# Patient Record
Sex: Male | Born: 1995 | Race: White | Hispanic: No | Marital: Single | Smoking: Never smoker
Health system: Southern US, Community
[De-identification: ages and names within clinical notes are randomized; demographics above are authoritative.]

---
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
## 2020-05-03 ENCOUNTER — Emergency Department (HOSPITAL_COMMUNITY)
Admission: EM | Admit: 2020-05-03 | Discharge: 2020-05-03 | Discharge status: Against Medical Advice | Payer: BLUE CROSS/BLUE SHIELD

## 2020-05-03 ENCOUNTER — Other Ambulatory Visit: Payer: Self-pay

## 2020-08-12 ENCOUNTER — Observation Stay: Payer: BLUE CROSS/BLUE SHIELD

## 2020-08-12 ENCOUNTER — Other Ambulatory Visit: Payer: Self-pay

## 2020-08-12 ENCOUNTER — Encounter: Payer: Self-pay | Admitting: Internal Medicine

## 2020-08-12 ENCOUNTER — Emergency Department: Payer: BLUE CROSS/BLUE SHIELD

## 2020-08-12 ENCOUNTER — Inpatient Hospital Stay
Admission: EM | Admit: 2020-08-12 | Discharge: 2020-08-15 | DRG: 418 | Disposition: A | Discharge status: Home / Self Care | Payer: BLUE CROSS/BLUE SHIELD | Attending: Internal Medicine | Admitting: Internal Medicine

## 2020-08-12 DIAGNOSIS — L03311 Cellulitis of abdominal wall: Secondary | ICD-10-CM | POA: Diagnosis not present

## 2020-08-12 DIAGNOSIS — Z6837 Body mass index (BMI) 37.0-37.9, adult: Secondary | ICD-10-CM

## 2020-08-12 DIAGNOSIS — T8149XA Infection following a procedure, other surgical site, initial encounter: Secondary | ICD-10-CM | POA: Diagnosis not present

## 2020-08-12 DIAGNOSIS — K8020 Calculus of gallbladder without cholecystitis without obstruction: Secondary | ICD-10-CM | POA: Diagnosis present

## 2020-08-12 DIAGNOSIS — K851 Biliary acute pancreatitis without necrosis or infection: Secondary | ICD-10-CM

## 2020-08-12 DIAGNOSIS — Z833 Family history of diabetes mellitus: Secondary | ICD-10-CM

## 2020-08-12 DIAGNOSIS — I10 Essential (primary) hypertension: Secondary | ICD-10-CM | POA: Diagnosis present

## 2020-08-12 DIAGNOSIS — K859 Acute pancreatitis without necrosis or infection, unspecified: Secondary | ICD-10-CM | POA: Diagnosis present

## 2020-08-12 DIAGNOSIS — K920 Hematemesis: Secondary | ICD-10-CM | POA: Diagnosis present

## 2020-08-12 DIAGNOSIS — E669 Obesity, unspecified: Secondary | ICD-10-CM | POA: Diagnosis present

## 2020-08-12 DIAGNOSIS — K760 Fatty (change of) liver, not elsewhere classified: Secondary | ICD-10-CM | POA: Diagnosis present

## 2020-08-12 DIAGNOSIS — Z8249 Family history of ischemic heart disease and other diseases of the circulatory system: Secondary | ICD-10-CM

## 2020-08-12 DIAGNOSIS — Z20822 Contact with and (suspected) exposure to covid-19: Secondary | ICD-10-CM | POA: Diagnosis present

## 2020-08-12 DIAGNOSIS — Y838 Other surgical procedures as the cause of abnormal reaction of the patient, or of later complication, without mention of misadventure at the time of the procedure: Secondary | ICD-10-CM | POA: Diagnosis not present

## 2020-08-12 LAB — URINALYSIS, COMPLETE (UACMP) WITH MICROSCOPIC
Bacteria, UA: NONE SEEN
Bilirubin Urine: NEGATIVE
Glucose, UA: NEGATIVE mg/dL
Hgb urine dipstick: NEGATIVE
Ketones, ur: 5 mg/dL — AB
Leukocytes,Ua: NEGATIVE
Nitrite: NEGATIVE
Protein, ur: 30 mg/dL — AB
Specific Gravity, Urine: 1.025 (ref 1.005–1.030)
Squamous Epithelial / LPF: NONE SEEN (ref 0–5)
pH: 7 (ref 5.0–8.0)

## 2020-08-12 LAB — COMPREHENSIVE METABOLIC PANEL
ALT: 166 U/L — ABNORMAL HIGH (ref 0–44)
AST: 63 U/L — ABNORMAL HIGH (ref 15–41)
Albumin: 4.2 g/dL (ref 3.5–5.0)
Alkaline Phosphatase: 103 U/L (ref 38–126)
Anion gap: 10 (ref 5–15)
BUN: 17 mg/dL (ref 6–20)
CO2: 23 mmol/L (ref 22–32)
Calcium: 8.5 mg/dL — ABNORMAL LOW (ref 8.9–10.3)
Chloride: 99 mmol/L (ref 98–111)
Creatinine, Ser: 0.67 mg/dL (ref 0.61–1.24)
GFR, Estimated: >60 mL/min (ref >60)
Glucose, Bld: 125 mg/dL — ABNORMAL HIGH (ref 70–99)
Potassium: 3.6 mmol/L (ref 3.5–5.1)
Sodium: 132 mmol/L — ABNORMAL LOW (ref 135–145)
Total Bilirubin: 1.5 mg/dL — ABNORMAL HIGH (ref 0.3–1.2)
Total Protein: 7.5 g/dL (ref 6.5–8.1)

## 2020-08-12 LAB — CBC
HCT: 46.8 % (ref 39.0–52.0)
Hemoglobin: 15.6 g/dL (ref 13.0–17.0)
MCH: 28.4 pg (ref 26.0–34.0)
MCHC: 33.3 g/dL (ref 30.0–36.0)
MCV: 85.1 fL (ref 80.0–100.0)
Platelets: 396 10*3/uL (ref 150–400)
RBC: 5.5 MIL/uL (ref 4.22–5.81)
RDW: 13.3 % (ref 11.5–15.5)
WBC: 14.9 10*3/uL — ABNORMAL HIGH (ref 4.0–10.5)
nRBC: 0 % (ref 0.0–0.2)

## 2020-08-12 LAB — LIPID PANEL
Cholesterol: 151 mg/dL (ref 0–200)
HDL: 37 mg/dL — ABNORMAL LOW
LDL Cholesterol: 97 mg/dL (ref 0–99)
Total CHOL/HDL Ratio: 4.1 ratio
Triglycerides: 85 mg/dL
VLDL: 17 mg/dL (ref 0–40)

## 2020-08-12 LAB — TSH: TSH: 1.119 u[IU]/mL (ref 0.350–4.500)

## 2020-08-12 LAB — RESP PANEL BY RT-PCR (FLU A&B, COVID) ARPGX2
Influenza A by PCR: NEGATIVE
Influenza B by PCR: NEGATIVE
SARS Coronavirus 2 by RT PCR: NEGATIVE

## 2020-08-12 LAB — LIPASE, BLOOD: Lipase: 344 U/L — ABNORMAL HIGH (ref 11–51)

## 2020-08-12 LAB — HEMOGLOBIN A1C
Hgb A1c MFr Bld: 5.5 % (ref 4.8–5.6)
Mean Plasma Glucose: 111.15 mg/dL

## 2020-08-12 LAB — HEMOGLOBIN: Hemoglobin: 14.7 g/dL (ref 13.0–17.0)

## 2020-08-12 MED ORDER — IOHEXOL 300 MG/ML  SOLN
125.0000 mL | Freq: Once | INTRAMUSCULAR | Status: AC | PRN
  Administered 2020-08-12: 19:00:00 100 mL via INTRAVENOUS
  Filled 2020-08-12: qty 125

## 2020-08-12 MED ORDER — IOHEXOL 300 MG/ML  SOLN
100.0000 mL | Freq: Once | INTRAMUSCULAR | Status: DC | PRN
  Filled 2020-08-12: qty 100

## 2020-08-12 MED ORDER — MORPHINE SULFATE (PF) 4 MG/ML IV SOLN
4.0000 mg | Freq: Once | INTRAVENOUS | Status: AC
  Administered 2020-08-12: 17:00:00 4 mg via INTRAVENOUS
  Filled 2020-08-12 (×2): qty 1

## 2020-08-12 MED ORDER — IOHEXOL 9 MG/ML PO SOLN
500.0000 mL | ORAL | Status: AC
  Administered 2020-08-12: 14:00:00 500 mL via ORAL
  Filled 2020-08-12 (×2): qty 500

## 2020-08-12 MED ORDER — HYDROCODONE-ACETAMINOPHEN 5-325 MG PO TABS
1.0000 | ORAL_TABLET | ORAL | Status: DC | PRN
  Administered 2020-08-12 – 2020-08-13 (×2): 2 via ORAL
  Filled 2020-08-12 (×2): qty 2

## 2020-08-12 MED ORDER — SODIUM CHLORIDE 0.9 % IV BOLUS
1000.0000 mL | Freq: Once | INTRAVENOUS | Status: AC
  Administered 2020-08-12: 15:00:00 1000 mL via INTRAVENOUS

## 2020-08-12 MED ORDER — IOHEXOL 9 MG/ML PO SOLN
1000.0000 mL | Freq: Once | ORAL | Status: DC | PRN
  Filled 2020-08-12: qty 1000

## 2020-08-12 MED ORDER — SODIUM CHLORIDE 0.9% FLUSH
3.0000 mL | Freq: Two times a day (BID) | INTRAVENOUS | Status: DC
  Administered 2020-08-12 – 2020-08-15 (×6): 3 mL via INTRAVENOUS

## 2020-08-12 MED ORDER — LIDOCAINE VISCOUS HCL 2 % MT SOLN
15.0000 mL | Freq: Once | OROMUCOSAL | Status: DC
Start: 2020-08-12 — End: 2020-08-12

## 2020-08-12 MED ORDER — ENOXAPARIN SODIUM 60 MG/0.6ML ~~LOC~~ SOLN
60.0000 mg | SUBCUTANEOUS | Status: DC
  Administered 2020-08-12: 17:00:00 60 mg via SUBCUTANEOUS
  Filled 2020-08-12: qty 0.6

## 2020-08-12 MED ORDER — ONDANSETRON HCL 4 MG/2ML IJ SOLN
4.0000 mg | Freq: Once | INTRAMUSCULAR | Status: AC
  Administered 2020-08-12: 17:00:00 4 mg via INTRAVENOUS
  Filled 2020-08-12: qty 2

## 2020-08-12 MED ORDER — SODIUM CHLORIDE 0.9 % IV SOLN: Freq: Once | INTRAVENOUS | Status: AC

## 2020-08-12 MED ORDER — KETOROLAC TROMETHAMINE 15 MG/ML IJ SOLN
15.0000 mg | Freq: Four times a day (QID) | INTRAMUSCULAR | Status: DC | PRN
  Administered 2020-08-12 – 2020-08-13 (×2): 15 mg via INTRAVENOUS
  Filled 2020-08-12 (×3): qty 1

## 2020-08-12 MED ORDER — PANTOPRAZOLE SODIUM 40 MG IV SOLR
40.0000 mg | Freq: Two times a day (BID) | INTRAVENOUS | Status: DC
  Administered 2020-08-12 – 2020-08-15 (×7): 40 mg via INTRAVENOUS
  Filled 2020-08-12 (×7): qty 40

## 2020-08-12 MED ORDER — LACTATED RINGERS IV SOLN: INTRAVENOUS | Status: DC

## 2020-08-12 MED ORDER — ALUM & MAG HYDROXIDE-SIMETH 200-200-20 MG/5ML PO SUSP: 30.0000 mL | Freq: Once | ORAL | Status: DC

## 2020-08-12 MED ORDER — ALBUTEROL SULFATE (2.5 MG/3ML) 0.083% IN NEBU
2.5000 mg | INHALATION_SOLUTION | RESPIRATORY_TRACT | Status: DC | PRN
Start: 2020-08-12 — End: 2020-08-15

## 2020-08-12 MED ORDER — SENNOSIDES-DOCUSATE SODIUM 8.6-50 MG PO TABS: 1.0000 | ORAL_TABLET | Freq: Every evening | ORAL | Status: DC | PRN

## 2020-08-12 MED ORDER — TECHNETIUM TC 99M MEBROFENIN IV KIT
5.0000 | PACK | Freq: Once | INTRAVENOUS | Status: AC | PRN
  Administered 2020-08-12: 16:00:00 4.981 via INTRAVENOUS
  Filled 2020-08-12: qty 5

## 2020-08-12 NOTE — H&P (Addendum)
History and Physical    Scott Floyd UXL:[id number] DOB: [birth year] DOA: 08/12/2020  PCP: Patient, No Pcp Per  Patient coming from: home  I have personally briefly reviewed patient's old medical records in [facility]  Chief Complaint:  Epigastric pain x 2 days with poor po  HPI: Scott Floyd is a 24 y.o. male with medical history significant of obesity , prior admit to urgent care 9/17 with RUQ and elevated lfts at that time he was referred from urgent care to ED but due to wait time left ED prior to being seen. He states his symptoms relating to that episode resolved on there own. He states he 1st noted these episode one year ago. He notes that they are intermittent worse with eating fatty foods, and dairy. He notes pain usually starts his his right upper quadrant and radiates to middle of abdomen. He states these episodes are also associated with intermittent vomitting even without eating. He notes the pain is sharp and intense. However patient notes these episodes would resolved on there own although each episode appears to be more severe than the last.  He notes  Present episidoe has been on going for the last 2 days. He notes pain started in right upper quadrant and radiates to his epigastrium and back. He notes associated nausea/vomitting with one episode of coffee grounding emesis around 3 am the morning of presentation.  He notes no dark stools associated with this or diarrhea, presyncope, palpitations or chest pain. He states since that time he has not had further emesis and has been able to tolerate liquids. He denies fever , chills , diarrhea, dark stools ,cough, or sob with these symptoms.  He denies history of tobacco, ETOH or illicit drug use.  ED Course: afeb, bp 156/100, sat 98% on ra  Labs: wbc 14.9, plt 396,  UA:neg for infection  Lipase 344, AST 34, ALT166,  t bili 1.5, alkphos 103  U/S: 1. Cholelithiasis. Gallbladder wall thickness upper normal. This circumstance may  warrant nuclear medicine hepatobiliary imaging study to assess for cystic duct patency.  2. Increase in liver echogenicity, an appearance most likely indicative of hepatic steatosis. No focal liver lesions evident. Note that the sensitivity of ultrasound for detection of focal liver lesions is diminished given this degree of apparent underlying hepatic steatosis. Review of Systems: As per HPI otherwise 10 point review of systems negative.  Intentional weight loss 50 lb over the last 4 months  No past medical history on file.  Obesity   No past surgical history on file. None   reports that he has never smoked. He does not have any smokeless tobacco history on file. He reports that he does not drink alcohol and does not use drugs.  No Known Allergies  No family history on file. Sister with Gallbladder disease  Father DMII, HTN Mother HTN    SH: No etoh  No drugs   Immunization  COVID vaccinated : last dose 04/18/20 pfizer   Prior to Admission medications   Medication Sig Start Date End Date Taking? Authorizing Provider  oxyCODONE-acetaminophen (PERCOCET/ROXICET) 5-325 MG per tablet Take 1-2 tablets by mouth every 4 (four) hours as needed for pain. 05/12/13   Molpus, John, MD    Physical Exam: Vitals:   08/12/20 0943  BP: (!) 156/100  Pulse: 84  Resp: 20  Temp: 98.5 F (36.9 C)  TempSrc: Oral  SpO2: 98%  Weight: 127 kg  Height: 6' (1.829 m)    Constitutional: NAD,  calm, comfortable Vitals:   08/12/20 0943  BP: (!) 156/100  Pulse: 84  Resp: 20  Temp: 98.5 F (36.9 C)  TempSrc: Oral  SpO2: 98%  Weight: 127 kg  Height: 6' (1.829 m)   Eyes: PERRL, lids and conjunctivae normal ENMT: Mucous membranes are moist. Posterior pharynx clear of any exudate or lesions.Normal dentition.  Neck: normal, supple, no masses, no thyromegaly Respiratory: clear to auscultation bilaterally, no wheezing, no crackles. Normal respiratory effort. No accessory muscle use.   Cardiovascular: Regular rate and rhythm, no murmurs / rubs / gallops. No extremity edema. 2+ pedal pulses. No carotid bruits.  Abdomen: RUQ, epigastric tenderness, no masses palpated. No rebound, No hepatosplenomegaly. Bowel sounds positive.  Musculoskeletal: no clubbing / cyanosis. No joint deformity upper and lower extremities. Good ROM, no contractures. Normal muscle tone.  Skin: no rashes, lesions, ulcers. No induration Neurologic: CN 2-12 grossly intact.Strength 5/5 in all 4.  Psychiatric: Normal judgment and insight. Alert and oriented x 3. Normal mood.    Labs on Admission: I have personally reviewed following labs and imaging studies  CBC: Recent Labs  Lab 08/12/20 1000  WBC 14.9*  HGB 15.6  HCT 46.8  MCV 85.1  PLT 396   Basic Metabolic Panel: Recent Labs  Lab 08/12/20 1000  NA 132*  K 3.6  CL 99  CO2 23  GLUCOSE 125*  BUN 17  CREATININE 0.67  CALCIUM 8.5*   GFR: Estimated Creatinine Clearance: 197.8 mL/min (by C-G formula based on SCr of 0.67 mg/dL). Liver Function Tests: Recent Labs  Lab 08/12/20 1000  AST 63*  ALT 166*  ALKPHOS 103  BILITOT 1.5*  PROT 7.5  ALBUMIN 4.2   Recent Labs  Lab 08/12/20 1000  LIPASE 344*   No results for input(s): AMMONIA in the last 168 hours. Coagulation Profile: No results for input(s): INR, PROTIME in the last 168 hours. Cardiac Enzymes: No results for input(s): CKTOTAL, CKMB, CKMBINDEX, TROPONINI in the last 168 hours. BNP (last 3 results) No results for input(s): PROBNP in the last 8760 hours. HbA1C: No results for input(s): HGBA1C in the last 72 hours. CBG: No results for input(s): GLUCAP in the last 168 hours. Lipid Profile: No results for input(s): CHOL, HDL, LDLCALC, TRIG, CHOLHDL, LDLDIRECT in the last 72 hours. Thyroid Function Tests: No results for input(s): TSH, T4TOTAL, FREET4, T3FREE, THYROIDAB in the last 72 hours. Anemia Panel: No results for input(s): VITAMINB12, FOLATE, FERRITIN, TIBC, IRON,  RETICCTPCT in the last 72 hours. Urine analysis:    Component Value Date/Time   COLORURINE YELLOW (A) 08/12/2020 1000   APPEARANCEUR HAZY (A) 08/12/2020 1000   LABSPEC 1.025 08/12/2020 1000   PHURINE 7.0 08/12/2020 1000   GLUCOSEU NEGATIVE 08/12/2020 1000   HGBUR NEGATIVE 08/12/2020 1000   BILIRUBINUR NEGATIVE 08/12/2020 1000   KETONESUR 5 (A) 08/12/2020 1000   PROTEINUR 30 (A) 08/12/2020 1000   NITRITE NEGATIVE 08/12/2020 1000   LEUKOCYTESUR NEGATIVE 08/12/2020 1000    Radiological Exams on Admission: US ABDOMEN LIMITED RUQ (LIVER/GB)  Result Date: 08/12/2020 CLINICAL DATA:  Pain with nausea and vomiting EXAM: ULTRASOUND ABDOMEN LIMITED RIGHT UPPER QUADRANT COMPARISON:  None. FINDINGS: Gallbladder: Within the gallbladder, there are echogenic foci which move and shadow consistent with cholelithiasis. Largest gallstone measures 7 mm in length. Gallbladder wall thickness is upper normal. There is no pericholecystic fluid. No sonographic Murphy sign noted by sonographer. Common bile duct: Diameter: 3 mm. No intrahepatic or extrahepatic biliary duct dilatation. Liver: No focal lesion identified. Liver echogenicity  overall increased. Portal vein is patent on color Doppler imaging with normal direction of blood flow towards the liver. Other: None. IMPRESSION: 1. Cholelithiasis. Gallbladder wall thickness upper normal. This circumstance may warrant nuclear medicine hepatobiliary imaging study to assess for cystic duct patency. 2. Increase in liver echogenicity, an appearance most likely indicative of hepatic steatosis. No focal liver lesions evident. Note that the sensitivity of ultrasound for detection of focal liver lesions is diminished given this degree of apparent underlying hepatic steatosis. Electronically Signed   By: Bretta Bang III M.D.   On: 08/12/2020 11:14    EKG: Independently reviewed. Normal ekg   Assessment/Plan Acute pancreatitis  -presumed due to gallbladder disease   -surgery has been consulted by ED MD  - will start ivfs , supportive pain medication  And antiemetic  - to be complete will check lipid panel  -tolerating clear currently will continue with clears for now  -CT scan for further evaluation of pancreas -consider MRCP   ? Episode of Hematemesis x1  -no signs of active bleeding , no repeat episode  -due to repeated vomiting x 2 days most likely gastritis  -ppi bid iv, monitor h/h , to be complete fob .  Acute Transaminitis  -due to Gallbladder disease  -u/s noted  Cholelithiasis but ducts appear not to be dilated  -monitor trend/fever curve  Cholelithiais  -Gallbladder wall thickness upper normal. This circumstance may warrant nuclear medicine hepatobiliary imaging study to assess for cystic duct patency. -HIDA scan ordered   Elevated Blood pressure  -due to pain  - no prior history of hypertension  - treat pain and reassess     Leukocytosis  -due to stress response related to acute pancreatitis  -monitor trend   Hepatic Steatosis  -lipid panel pending  -CT scan for further evaluation   Obesity  -patient has lost 50 lbs over the last 4 months  -continue to encourage diet and exercise  -for addition assistance would recommend nutrition consult    DVT prophylaxis:lovenox  Code Status: FULL Family Communication: n./a Disposition Plan:  Patient is expected to be admitted less than 2 midnights  Consults called: GI  Admission status: observation    Lurline Del MD [facility]  If 7PM-7AM, please contact night-coverage [email or web address] Password [email or web address]  08/12/2020, 12:30 PM

## 2020-08-12 NOTE — ED Notes (Signed)
Pt. gone to nuclear medicine 

## 2020-08-12 NOTE — ED Triage Notes (Signed)
Pt states for the past 2 days he has been vomiting and having upper abd that radiates through to his back, reports that he was diagnosed with an inflammed pancreas a few months ago but was unable to get treated for it. Pt reports the pain reminds him of that

## 2020-08-12 NOTE — ED Notes (Signed)
Patient transported to CT 

## 2020-08-12 NOTE — ED Provider Notes (Signed)
[facility] Emergency Department Provider Note  Time seen: 10:25 AM  I have reviewed the triage vital signs and the nursing notes.   HISTORY  Chief Complaint Abdominal Pain   HPI Scott Floyd is a 24 y.o. male with no significant past medical history presents to the emergency department for upper abdominal pain.   According to the patient over the past 2 days he has been experiencing upper abdominal pain nausea vomiting.  Denies any diarrhea.  Patient states over the past year or so this is occurred multiple times mostly overnight while he will awake with upper abdominal pain and vomiting.  Patient states he was diagnosed with an inflamed pancreas several months ago but it just had a child and was not treated for anything.  Patient states this is one of the more severe episodes he has had with upper abdominal pain especially right upper quadrant pain.  Patient also states 9/10 aching type pain.  Pain radiates to his back.  Patient does not drink alcohol.  No past medical history on file.  There are no problems to display for this patient.   No past surgical history on file.  Prior to Admission medications   Medication Sig Start Date End Date Taking? Authorizing Provider  oxyCODONE-acetaminophen (PERCOCET/ROXICET) 5-325 MG per tablet Take 1-2 tablets by mouth every 4 (four) hours as needed for pain. 05/12/13   Molpus, John, MD    No Known Allergies  No family history on file.  Social History Social History   Tobacco Use  . Smoking status: Never Smoker  Substance Use Topics  . Alcohol use: No  . Drug use: No    Review of Systems Constitutional: Negative for fever. Cardiovascular: Negative for chest pain. Respiratory: Negative for shortness of breath. Gastrointestinal: Upper abdominal pain.  Positive nausea vomiting.  Negative for diarrhea. Genitourinary: Negative for urinary compaints Musculoskeletal: Negative for musculoskeletal  complaints Neurological: Negative for headache All other ROS negative  ____________________________________________   PHYSICAL EXAM:  VITAL SIGNS: ED Triage Vitals [08/12/20 0943]  Enc Vitals Group     BP (!) 156/100     Pulse Rate 84     Resp 20     Temp 98.5 F (36.9 C)     Temp Source Oral     SpO2 98 %     Weight 280 lb (127 kg)     Height 6' (1.829 m)     Head Circumference      Peak Flow      Pain Score 10     Pain Loc      Pain Edu?      Excl. in GC?    Constitutional: Alert and oriented. Well appearing and in no distress. Eyes: Normal exam ENT      Head: Normocephalic and atraumatic.      Mouth/Throat: Mucous membranes are moist. Cardiovascular: Normal rate, regular rhythm.  Respiratory: Normal respiratory effort without tachypnea nor retractions. Breath sounds are clear  Gastrointestinal: Soft, moderate epigastric tenderness.  Otherwise benign abdomen without rebound or guarding.  No distention. Musculoskeletal: Nontender with normal range of motion in all extremities.  Neurologic:  Normal speech and language. No gross focal neurologic deficits  Skin:  Skin is warm, dry and intact.  Psychiatric: Mood and affect are normal.   ____________________________________________    EKG  EKG viewed and interpreted by myself shows a normal sinus rhythm at 91 bpm with a narrow QRS, normal axis, normal intervals, no concerning ST changes.  ____________________________________________    RADIOLOGY  Ultrasound shows cholelithiasis but no signs of cholecystitis.  ____________________________________________   INITIAL IMPRESSION / ASSESSMENT AND PLAN / ED COURSE  Pertinent labs & imaging results that were available during my care of the patient were reviewed by me and considered in my medical decision making (see chart for details).   Patient presents to the emergency department for upper abdominal pain over the past 2 days along with nausea vomiting.   Differential is quite broad would include pancreatitis, cholecystitis, gastritis, gastroenteritis.  We will dose a GI cocktail.  Lab work is pending.  Ultrasound shows cholelithiasis without cholecystitis.  Patient's lipase is elevated greater than 300.  Patient states he has not been able to tolerate liquids or food for the past 48 hours.  Patient will be admitted for acute pancreatitis.  Patient agreeable to plan of care.  Scott Floyd was evaluated in Emergency Department on 08/12/2020 for the symptoms described in the history of present illness. He was evaluated in the context of the global COVID-19 pandemic, which necessitated consideration that the patient might be at risk for infection with the SARS-CoV-2 virus that causes COVID-19. Institutional protocols and algorithms that pertain to the evaluation of patients at risk for COVID-19 are in a state of rapid change based on information released by regulatory bodies including the CDC and federal and state organizations. These policies and algorithms were followed during the patient's care in the ED.  ____________________________________________   FINAL CLINICAL IMPRESSION(S) / ED DIAGNOSES  Upper abdominal pain Acute pancreatitis   Minna Antis, MD 08/12/20 1459

## 2020-08-12 NOTE — ED Notes (Signed)
Pt back from nuclear medicine.

## 2020-08-13 ENCOUNTER — Observation Stay: Payer: BLUE CROSS/BLUE SHIELD | Admitting: Anesthesiology

## 2020-08-13 ENCOUNTER — Encounter: Payer: Self-pay | Admitting: Internal Medicine

## 2020-08-13 ENCOUNTER — Encounter
Admission: EM | Disposition: A | Discharge status: Home / Self Care | Payer: Self-pay | Source: Home / Self Care | Attending: Internal Medicine

## 2020-08-13 ENCOUNTER — Other Ambulatory Visit: Payer: Self-pay

## 2020-08-13 DIAGNOSIS — K859 Acute pancreatitis without necrosis or infection, unspecified: Secondary | ICD-10-CM

## 2020-08-13 DIAGNOSIS — K8010 Calculus of gallbladder with chronic cholecystitis without obstruction: Secondary | ICD-10-CM | POA: Diagnosis not present

## 2020-08-13 DIAGNOSIS — K851 Biliary acute pancreatitis without necrosis or infection: Secondary | ICD-10-CM

## 2020-08-13 LAB — COMPREHENSIVE METABOLIC PANEL
ALT: 95 U/L — ABNORMAL HIGH (ref 0–44)
AST: 22 U/L (ref 15–41)
Albumin: 3.5 g/dL (ref 3.5–5.0)
Alkaline Phosphatase: 88 U/L (ref 38–126)
Anion gap: 8 (ref 5–15)
BUN: 14 mg/dL (ref 6–20)
CO2: 24 mmol/L (ref 22–32)
Calcium: 8.5 mg/dL — ABNORMAL LOW (ref 8.9–10.3)
Chloride: 104 mmol/L (ref 98–111)
Creatinine, Ser: 0.76 mg/dL (ref 0.61–1.24)
GFR, Estimated: >60 mL/min (ref >60)
Glucose, Bld: 81 mg/dL (ref 70–99)
Potassium: 3.6 mmol/L (ref 3.5–5.1)
Sodium: 136 mmol/L (ref 135–145)
Total Bilirubin: 1.5 mg/dL — ABNORMAL HIGH (ref 0.3–1.2)
Total Protein: 6.6 g/dL (ref 6.5–8.1)

## 2020-08-13 LAB — CBC
HCT: 42.6 % (ref 39.0–52.0)
Hemoglobin: 14.2 g/dL (ref 13.0–17.0)
MCH: 28.7 pg (ref 26.0–34.0)
MCHC: 33.3 g/dL (ref 30.0–36.0)
MCV: 86.2 fL (ref 80.0–100.0)
Platelets: 355 10*3/uL (ref 150–400)
RBC: 4.94 MIL/uL (ref 4.22–5.81)
RDW: 13.2 % (ref 11.5–15.5)
WBC: 12.6 10*3/uL — ABNORMAL HIGH (ref 4.0–10.5)
nRBC: 0 % (ref 0.0–0.2)

## 2020-08-13 LAB — HIV ANTIBODY (ROUTINE TESTING W REFLEX): HIV Screen 4th Generation wRfx: NONREACTIVE

## 2020-08-13 LAB — LIPASE, BLOOD: Lipase: 64 U/L — ABNORMAL HIGH (ref 11–51)

## 2020-08-13 SURGERY — CHOLECYSTECTOMY, ROBOT-ASSISTED, LAPAROSCOPIC
Anesthesia: General

## 2020-08-13 MED ORDER — VISTASEAL 10 ML SINGLE DOSE KIT
PACK | CUTANEOUS | Status: DC | PRN
  Administered 2020-08-13: 10 mL via TOPICAL

## 2020-08-13 MED ORDER — BUPIVACAINE HCL (PF) 0.25 % IJ SOLN
INTRAMUSCULAR | Status: AC
  Filled 2020-08-13: qty 30

## 2020-08-13 MED ORDER — LIDOCAINE HCL (CARDIAC) PF 100 MG/5ML IV SOSY: PREFILLED_SYRINGE | INTRAVENOUS | Status: DC | PRN

## 2020-08-13 MED ORDER — SEVOFLURANE IN SOLN
RESPIRATORY_TRACT | Status: AC
  Filled 2020-08-13: qty 250

## 2020-08-13 MED ORDER — LIDOCAINE HCL (CARDIAC) PF 100 MG/5ML IV SOSY
PREFILLED_SYRINGE | INTRAVENOUS | Status: DC | PRN
  Administered 2020-08-13: 60 mg via INTRAVENOUS

## 2020-08-13 MED ORDER — ACETAMINOPHEN 500 MG PO TABS
1000.0000 mg | ORAL_TABLET | Freq: Four times a day (QID) | ORAL | Status: DC
  Administered 2020-08-13 – 2020-08-15 (×5): 1000 mg via ORAL
  Filled 2020-08-13 (×8): qty 2

## 2020-08-13 MED ORDER — PROPOFOL 10 MG/ML IV BOLUS
INTRAVENOUS | Status: DC | PRN
  Administered 2020-08-13: 200 mg via INTRAVENOUS

## 2020-08-13 MED ORDER — LIDOCAINE-EPINEPHRINE 1 %-1:100000 IJ SOLN
INTRAMUSCULAR | Status: AC
  Filled 2020-08-13: qty 1

## 2020-08-13 MED ORDER — ROCURONIUM BROMIDE 100 MG/10ML IV SOLN
INTRAVENOUS | Status: DC | PRN
  Administered 2020-08-13: 5 mg via INTRAVENOUS
  Administered 2020-08-13: 55 mg via INTRAVENOUS
  Administered 2020-08-13: 40 mg via INTRAVENOUS

## 2020-08-13 MED ORDER — FENTANYL CITRATE (PF) 100 MCG/2ML IJ SOLN
INTRAMUSCULAR | Status: AC
  Administered 2020-08-13: 15:00:00 25 ug via INTRAVENOUS
  Filled 2020-08-13: qty 2

## 2020-08-13 MED ORDER — FENTANYL CITRATE (PF) 100 MCG/2ML IJ SOLN
INTRAMUSCULAR | Status: AC
  Filled 2020-08-13: qty 2

## 2020-08-13 MED ORDER — LIDOCAINE-EPINEPHRINE 1 %-1:100000 IJ SOLN
INTRAMUSCULAR | Status: DC | PRN
  Administered 2020-08-13: 10 mL via INTRAMUSCULAR

## 2020-08-13 MED ORDER — LACTATED RINGERS IV SOLN: INTRAVENOUS | Status: DC

## 2020-08-13 MED ORDER — SUCCINYLCHOLINE CHLORIDE 20 MG/ML IJ SOLN
INTRAMUSCULAR | Status: DC | PRN
  Administered 2020-08-13: 140 mg via INTRAVENOUS

## 2020-08-13 MED ORDER — FENTANYL CITRATE (PF) 100 MCG/2ML IJ SOLN
INTRAMUSCULAR | Status: DC | PRN
  Administered 2020-08-13 (×5): 50 ug via INTRAVENOUS

## 2020-08-13 MED ORDER — FENTANYL CITRATE (PF) 100 MCG/2ML IJ SOLN
25.0000 ug | INTRAMUSCULAR | Status: DC | PRN
Start: 2020-08-13 — End: 2020-08-13

## 2020-08-13 MED ORDER — KETOROLAC TROMETHAMINE 30 MG/ML IJ SOLN
INTRAMUSCULAR | Status: DC | PRN
  Administered 2020-08-13: 30 mg via INTRAVENOUS

## 2020-08-13 MED ORDER — CHLORHEXIDINE GLUCONATE 0.12 % MT SOLN: 15.0000 mL | Freq: Once | OROMUCOSAL | Status: AC

## 2020-08-13 MED ORDER — FENTANYL CITRATE (PF) 100 MCG/2ML IJ SOLN
INTRAMUSCULAR | Status: AC
  Administered 2020-08-13: 16:00:00 25 ug via INTRAVENOUS
  Filled 2020-08-13: qty 2

## 2020-08-13 MED ORDER — ONDANSETRON HCL 4 MG/2ML IJ SOLN: 4.0000 mg | Freq: Four times a day (QID) | INTRAMUSCULAR | Status: DC | PRN

## 2020-08-13 MED ORDER — MIDAZOLAM HCL 2 MG/2ML IJ SOLN
INTRAMUSCULAR | Status: AC
  Filled 2020-08-13: qty 2

## 2020-08-13 MED ORDER — HYDROMORPHONE HCL 1 MG/ML IJ SOLN
0.5000 mg | INTRAMUSCULAR | Status: DC | PRN
  Administered 2020-08-13 – 2020-08-15 (×4): 0.5 mg via INTRAVENOUS
  Filled 2020-08-13 (×4): qty 0.5

## 2020-08-13 MED ORDER — CHLORHEXIDINE GLUCONATE 0.12 % MT SOLN
OROMUCOSAL | Status: AC
  Administered 2020-08-13: 12:00:00 15 mL via OROMUCOSAL
  Filled 2020-08-13: qty 15

## 2020-08-13 MED ORDER — DEXAMETHASONE SODIUM PHOSPHATE 10 MG/ML IJ SOLN
INTRAMUSCULAR | Status: DC | PRN
  Administered 2020-08-13: 10 mg via INTRAVENOUS

## 2020-08-13 MED ORDER — INDOCYANINE GREEN 25 MG IV SOLR
7.5000 mg | Freq: Once | INTRAVENOUS | Status: AC
  Administered 2020-08-13: 13:00:00 7.5 mg via INTRAVENOUS
  Filled 2020-08-13: qty 3

## 2020-08-13 MED ORDER — SODIUM CHLORIDE 0.9 % IV SOLN: INTRAVENOUS | Status: DC

## 2020-08-13 MED ORDER — SUGAMMADEX SODIUM 500 MG/5ML IV SOLN
INTRAVENOUS | Status: DC | PRN
  Administered 2020-08-13: 260 mg via INTRAVENOUS

## 2020-08-13 MED ORDER — KETOROLAC TROMETHAMINE 30 MG/ML IJ SOLN
INTRAMUSCULAR | Status: AC
  Filled 2020-08-13: qty 1

## 2020-08-13 MED ORDER — ONDANSETRON HCL 4 MG/2ML IJ SOLN
INTRAMUSCULAR | Status: DC | PRN
  Administered 2020-08-13: 4 mg via INTRAVENOUS

## 2020-08-13 MED ORDER — KETOROLAC TROMETHAMINE 15 MG/ML IJ SOLN
15.0000 mg | Freq: Four times a day (QID) | INTRAMUSCULAR | Status: DC | PRN
  Administered 2020-08-14: 15 mg via INTRAVENOUS
  Filled 2020-08-13: qty 1

## 2020-08-13 MED ORDER — ONDANSETRON HCL 4 MG/2ML IJ SOLN: 4.0000 mg | Freq: Once | INTRAMUSCULAR | Status: DC | PRN

## 2020-08-13 MED ORDER — FENTANYL CITRATE (PF) 100 MCG/2ML IJ SOLN
25.0000 ug | INTRAMUSCULAR | Status: DC | PRN
  Administered 2020-08-13 (×4): 25 ug via INTRAVENOUS

## 2020-08-13 MED ORDER — IBUPROFEN 400 MG PO TABS: 800.0000 mg | ORAL_TABLET | Freq: Four times a day (QID) | ORAL | Status: DC | PRN

## 2020-08-13 MED ORDER — SODIUM CHLORIDE 0.9 % IV SOLN
INTRAVENOUS | Status: AC
  Filled 2020-08-13: qty 2

## 2020-08-13 MED ORDER — SUGAMMADEX SODIUM 500 MG/5ML IV SOLN
INTRAVENOUS | Status: AC
  Filled 2020-08-13: qty 5

## 2020-08-13 MED ORDER — HYDROCODONE-ACETAMINOPHEN 5-325 MG PO TABS
1.0000 | ORAL_TABLET | ORAL | Status: DC | PRN
  Administered 2020-08-13 – 2020-08-14 (×2): 2 via ORAL
  Administered 2020-08-14: 1 via ORAL
  Filled 2020-08-13: qty 2
  Filled 2020-08-13 (×2): qty 1
  Filled 2020-08-13: qty 2

## 2020-08-13 MED ORDER — SODIUM CHLORIDE 0.9 % IV SOLN
2.0000 g | INTRAVENOUS | Status: AC
  Administered 2020-08-13: 13:00:00 2 g via INTRAVENOUS
  Filled 2020-08-13: qty 2

## 2020-08-13 MED ORDER — PROPOFOL 10 MG/ML IV BOLUS
INTRAVENOUS | Status: AC
  Filled 2020-08-13: qty 20

## 2020-08-13 MED ORDER — MIDAZOLAM HCL 2 MG/2ML IJ SOLN
INTRAMUSCULAR | Status: DC | PRN
  Administered 2020-08-13: 2 mg via INTRAVENOUS

## 2020-08-13 MED ORDER — VISTASEAL 10 ML SINGLE DOSE KIT
PACK | CUTANEOUS | Status: AC
  Filled 2020-08-13: qty 10

## 2020-08-13 SURGICAL SUPPLY — 60 items
APPLICATOR VISTASEAL 35 (MISCELLANEOUS) IMPLANT
APPLICATOR VISTASEAL FLEXIBLE (MISCELLANEOUS) ×3 IMPLANT
BAG INFUSER PRESSURE 100CC (MISCELLANEOUS) ×3 IMPLANT
BLADE SURG 11 STRL SS (BLADE) ×3 IMPLANT
CANISTER SUCT 1200ML W/VALVE (MISCELLANEOUS) ×3 IMPLANT
CANNULA REDUC XI 12-8 STAPL (CANNULA) ×1
CANNULA REDUCER 12-8 DVNC XI (CANNULA) ×2 IMPLANT
CHLORAPREP W/TINT 26 (MISCELLANEOUS) ×3 IMPLANT
CLIP VESOLOCK MED LG 6/CT (CLIP) ×6 IMPLANT
COVER TIP SHEARS 8 DVNC (MISCELLANEOUS) ×2 IMPLANT
COVER TIP SHEARS 8MM DA VINCI (MISCELLANEOUS) ×1
COVER WAND RF STERILE (DRAPES) ×3 IMPLANT
DECANTER SPIKE VIAL GLASS SM (MISCELLANEOUS) ×6 IMPLANT
DEFOGGER SCOPE WARMER CLEARIFY (MISCELLANEOUS) ×3 IMPLANT
DERMABOND ADVANCED (GAUZE/BANDAGES/DRESSINGS) ×1
DERMABOND ADVANCED .7 DNX12 (GAUZE/BANDAGES/DRESSINGS) ×2 IMPLANT
DRAPE ARM DVNC X/XI (DISPOSABLE) ×8 IMPLANT
DRAPE COLUMN DVNC XI (DISPOSABLE) ×2 IMPLANT
DRAPE DA VINCI XI ARM (DISPOSABLE) ×4
DRAPE DA VINCI XI COLUMN (DISPOSABLE) ×1
ELECT CAUTERY BLADE TIP 2.5 (TIP) ×3
ELECT REM PT RETURN 9FT ADLT (ELECTROSURGICAL) ×3
ELECTRODE CAUTERY BLDE TIP 2.5 (TIP) ×2 IMPLANT
ELECTRODE REM PT RTRN 9FT ADLT (ELECTROSURGICAL) ×2 IMPLANT
GLOVE BIO SURGEON STRL SZ 6.5 (GLOVE) ×6 IMPLANT
GLOVE INDICATOR 7.0 STRL GRN (GLOVE) ×6 IMPLANT
GOWN STRL REUS W/ TWL LRG LVL3 (GOWN DISPOSABLE) ×6 IMPLANT
GOWN STRL REUS W/TWL LRG LVL3 (GOWN DISPOSABLE) ×3
GRASPER SUT TROCAR 14GX15 (MISCELLANEOUS) IMPLANT
IRRIGATOR SUCT 8 DISP DVNC XI (IRRIGATION / IRRIGATOR) ×2 IMPLANT
IRRIGATOR SUCTION 8MM XI DISP (IRRIGATION / IRRIGATOR) ×1
IV NS 1000ML (IV SOLUTION) ×1
IV NS 1000ML BAXH (IV SOLUTION) ×2 IMPLANT
KIT PINK PAD W/HEAD ARE REST (MISCELLANEOUS) ×3
KIT PINK PAD W/HEAD ARM REST (MISCELLANEOUS) ×2 IMPLANT
LABEL OR SOLS (LABEL) ×3 IMPLANT
MANIFOLD NEPTUNE II (INSTRUMENTS) ×3 IMPLANT
NEEDLE HYPO 22GX1.5 SAFETY (NEEDLE) ×3 IMPLANT
NEEDLE INSUFFLATION 14GA 120MM (NEEDLE) ×3 IMPLANT
NS IRRIG 500ML POUR BTL (IV SOLUTION) ×3 IMPLANT
OBTURATOR OPTICAL STANDARD 8MM (TROCAR) ×1
OBTURATOR OPTICAL STND 8 DVNC (TROCAR) ×2
OBTURATOR OPTICALSTD 8 DVNC (TROCAR) ×2 IMPLANT
PACK LAP CHOLECYSTECTOMY (MISCELLANEOUS) ×3 IMPLANT
PENCIL ELECTRO HAND CTR (MISCELLANEOUS) ×3 IMPLANT
POUCH SPECIMEN RETRIEVAL 10MM (ENDOMECHANICALS) ×3 IMPLANT
SEAL CANN UNIV 5-8 DVNC XI (MISCELLANEOUS) ×6 IMPLANT
SEAL XI 5MM-8MM UNIVERSAL (MISCELLANEOUS) ×3
SET TUBE SMOKE EVAC HIGH FLOW (TUBING) ×3 IMPLANT
SOLUTION ELECTROLUBE (MISCELLANEOUS) ×3 IMPLANT
STAPLER CANNULA SEAL DVNC XI (STAPLE) ×2 IMPLANT
STAPLER CANNULA SEAL XI (STAPLE) ×1
STRIP CLOSURE SKIN 1/2X4 (GAUZE/BANDAGES/DRESSINGS) ×3 IMPLANT
SUT MNCRL 4-0 (SUTURE) ×1
SUT MNCRL 4-0 27XMFL (SUTURE) ×2
SUT VIC AB 3-0 SH 27 (SUTURE) ×1
SUT VIC AB 3-0 SH 27X BRD (SUTURE) ×2 IMPLANT
SUT VICRYL 0 AB UR-6 (SUTURE) ×3 IMPLANT
SUTURE MNCRL 4-0 27XMF (SUTURE) ×2 IMPLANT
TROCAR XCEL NON-BLD 5MMX100MML (ENDOMECHANICALS) ×3 IMPLANT

## 2020-08-13 NOTE — Anesthesia Preprocedure Evaluation (Signed)
Anesthesia Evaluation  Patient identified by MRN, date of birth, ID band Patient awake    Reviewed: Allergy & Precautions, H&P , NPO status , Patient's Chart, lab work & pertinent test results, reviewed documented beta blocker date and time   Airway Mallampati: II  TM Distance: >3 FB Neck ROM: full    Dental  (+) Teeth Intact   Pulmonary neg pulmonary ROS,    Pulmonary exam normal        Cardiovascular Exercise Tolerance: Good negative cardio ROS Normal cardiovascular exam Rhythm:regular Rate:Normal     Neuro/Psych negative neurological ROS  negative psych ROS   GI/Hepatic negative GI ROS, Neg liver ROS,   Endo/Other  negative endocrine ROS  Renal/GU negative Renal ROS  negative genitourinary   Musculoskeletal   Abdominal   Peds  Hematology negative hematology ROS (+)   Anesthesia Other Findings History reviewed. No pertinent past medical history. History reviewed. No pertinent surgical history. BMI    Body Mass Index: 37.97 kg/m     Reproductive/Obstetrics negative OB ROS                             Anesthesia Physical Anesthesia Plan  ASA: II  Anesthesia Plan: General ETT   Post-op Pain Management:    Induction:   PONV Risk Score and Plan:   Airway Management Planned:   Additional Equipment:   Intra-op Plan:   Post-operative Plan:   Informed Consent: I have reviewed the patients History and Physical, chart, labs and discussed the procedure including the risks, benefits and alternatives for the proposed anesthesia with the patient or authorized representative who has indicated his/her understanding and acceptance.     Dental Advisory Given  Plan Discussed with: CRNA  Anesthesia Plan Comments:         Anesthesia Quick Evaluation

## 2020-08-13 NOTE — Anesthesia Procedure Notes (Signed)
Procedure Name: Intubation Date/Time: 08/13/2020 12:38 PM Performed by: Omer Jack, CRNA Pre-anesthesia Checklist: Patient identified, Patient being monitored, Timeout performed, Emergency Drugs available and Suction available Patient Re-evaluated:Patient Re-evaluated prior to induction Oxygen Delivery Method: Circle system utilized Preoxygenation: Pre-oxygenation with 100% oxygen Induction Type: IV induction Ventilation: Mask ventilation without difficulty Laryngoscope Size: 3 and McGraph Grade View: Grade II Tube type: Oral Tube size: 7.5 mm Number of attempts: 1 Airway Equipment and Method: Stylet Placement Confirmation: ETT inserted through vocal cords under direct vision,  positive ETCO2 and breath sounds checked- equal and bilateral Secured at: 21 cm Tube secured with: Tape Dental Injury: Teeth and Oropharynx as per pre-operative assessment

## 2020-08-13 NOTE — Op Note (Signed)
Operative note  Pre-operative Diagnosis: gallstone pancreatitis  Post-operative Diagnosis: same  Procedure: Robot assisted laparoscopic cholecystectomy  Surgeon: Duanne Guess, MD  Anesthesia: GETA  Findings: normal biliary anatomy, no inflammation to suggest acute cholecystitis  Estimated Blood Loss: <10 cc       Specimens: Gallbladder           Complications: none immediately apparent  Procedure In Detail: The patient was seen again in the holding room. The benefits, complications, treatment options, and expected outcomes were discussed with the patient. The risks of bleeding, infection, recurrence of symptoms, failure to resolve symptoms, bile duct damage, bile duct leak, retained common bile duct stone, bowel injury, any of which could require further surgery and/or ERCP, stent, or papillotomy were reviewed with the patient. The likelihood of improving the patient's symptoms with return to their baseline status is good.  The patient and/or family concurred with the proposed plan, giving informed consent.  The patient was taken to operating room, identified and the procedure verified as laparoscopic cholecystectomy.  A time out was held and the above information confirmed.  Prior to the induction of general anesthesia, antibiotic prophylaxis was administered. VTE prophylaxis was in place. General endotracheal anesthesia was then administered and tolerated well. After the induction, the abdomen was prepped with Chloraprep and draped in the sterile fashion. The patient was positioned in the supine position.  Optiview technique was used to enter the abdomen in the right upper quadrant. Pneumoperitoneum was then created with CO2 and tolerated well without any adverse changes in the patient's vital signs.  A 12-mm port and three 8-mm ports were placed under direct vision.  All skin incisions  were infiltrated with a local anesthetic agent before making the incision and placing the trocars.    The patient was positioned  in 15 degrees of reverse Trendelenburg and tilted 10 degrees to the left.  The robot was brought to the surgical field and docked in the standard fashion.  We made sure all the instrumentation was kept in direct view at all times and that there were no collision between the arms.  I scrubbed out and went to the console.  The gallbladder was identified, the fundus grasped and retracted cephalad. Adhesions were lysed bluntly. The infundibulum was grasped and retracted laterally, exposing the peritoneum overlying the triangle of Calot. This was then divided and exposed in a blunt fashion. An extended critical view of the cystic duct and cystic artery was obtained.  The cystic duct was clearly identified and bluntly dissected away from the surrounding tissues, as was the cystic artery.  Using ICG cholangiography we visualized the cystic duct and confirmed that there was no aberrant biliary ductal anatomy nor any evidence of bile duct injury.  Both cystic duct and cystic artery were clipped and divided. The gallbladder was taken from the gallbladder fossa in a retrograde fashion with the electrocautery. Hemostasis was achieved with the electrocautery and with Vistaseal. Inspection of the right upper quadrant was performed. No bleeding, bile duct injury or leak, or bowel injury was noted.  The gallbladder was then placed in an Endopouch bag. The robotic instruments were removed and robotic arms were undocked in the standard fashion.    I scrubbed back in.  The gallbladder was removed via the 12 mm trocar site.  Using the PMI and a Carter-Thomason cone, the 12 mm port site was then closed with interrupted 0 Vicryl sutures.  The remaining 8 mm ports were removed and pneumoperitoneum was released. The  12-mm port site was closed with deep dermal 3-0 Vicryl.  4-0 subcuticular Monocryl was used to close the skin of all sites. Dermabond was applied, followed by Steri-Strips.  The patient  was then awakened, extubated, and taken to the postanesthesia recovery unit in stable condition.   Sponge, lap, and needle counts were reported to be correct number at closure and at the conclusion of the case.          Duanne Guess, MD FACS

## 2020-08-13 NOTE — Progress Notes (Addendum)
PROGRESS NOTE    Scott Floyd  BOF:[id number] DOB: 08-Jun-1996 DOA: 08/12/2020 PCP: Patient, No Pcp Per   Chief Complain: Abdominal pain  Brief Narrative:  Patient is a 24 year old male with history of obesity, who presented to the emergency department with complaints of epigastric pain for 2 days.  He has been having the symptoms intermittently since few months.  Pain gets worse with eating fatty food, dairy.  His pain usually starts at right upper quadrant, radiates to the epigastric region and back.  Patient also reported nausea and vomiting and an episode of coffee-ground emesis but no report of dark stools.  On presentation he was mildly hypertensive.  He was found to have elevated liver enzymes, elevated lipase.  Patient was admitted for the management of acute pancreatitis.  Assessment & Plan:   Active Problems:   Acute pancreatitis   Acute pancreatitis: Liver enzymes, lipase were elevated on presentation but has nearly normalized now.  Ultrasound of right upper quadrant  showed  gallstones.  HIDA scan negative for cholecystitis.  CT abdomen/pelvis showed no dilation of CBD.  Patient has been having recurrent symptoms in the past with right upper quadrant pain mainly precipitated with fatty food.  Symptoms consistent with biliary colic.  Imaging show hepatic steatosis We will continue current management for now.  His abdominal pain is improved so we will advance diet to full.  Continue IV fluids, antiemetics, analgesics.  I have requested general surgery consultation. Lipid panel shows normal triglyceride.  Patient does not drink alcohol. Likely biliary  pancreatitis  Episode of hematemesis: No active bleeding at present.  Hemoglobin stable.  Continue PPI  Elevated liver enzymes: Improving.  He might have passed stone or something.  No dilation of CBD seen on imaging  Hypertension: Currently blood stable.  Does not take any medications at home.  Obesity: BMI  37.9.  Leukocytosis: Improving          DVT prophylaxis:Lovenox Code Status: Full Family Communication: None at bedside Status is: Observation  The patient remains OBS appropriate and will d/c before 2 midnights.  Dispo: The patient is from: Home              Anticipated d/c is to: Home              Anticipated d/c date is: 1 day              Patient currently is not medically stable to d/c.    Consultants: General surgery Procedures: None  Antimicrobials:  Anti-infectives (From admission, onward)   None      Subjective: Patient seen and examined at the bedside this morning.  Hemodynamically stable.  Appears more comfortable.  His abdominal pain is improved but he says it comes and goes.  Patient very concerned about his recurrent abdominal pain  Objective: Vitals:   08/12/20 1800 08/12/20 1830 08/12/20 2023 08/13/20 0521  BP: (!) 153/90 (!) 156/95 (!) 146/94 122/86  Pulse: 85 72 77 73  Resp:   18 20  Temp:   98.3 F (36.8 C) 97.7 F (36.5 C)  TempSrc:   Oral Oral  SpO2: 96% 98% 98% 95%  Weight:      Height:        Intake/Output Summary (Last 24 hours) at 08/13/2020 1023 Last data filed at 08/13/2020 1010 Gross per 24 hour  Intake 1437.86 ml  Output --  Net 1437.86 ml   Filed Weights   08/12/20 0943  Weight: 127 kg  Examination:  General exam: Appears calm and comfortable ,Not in distress, morbidly obese HEENT:PERRL,Oral mucosa moist, Ear/Nose normal on gross exam Respiratory system: Bilateral equal air entry, normal vesicular breath sounds, no wheezes or crackles  Cardiovascular system: S1 & S2 heard, RRR. No JVD, murmurs, rubs, gallops or clicks. No pedal edema. Gastrointestinal system: Abdomen is nondistended, soft and is mildly tender in the epigastric region. No organomegaly or masses felt. Normal bowel sounds heard. Central nervous system: Alert and oriented. No focal neurological deficits. Extremities: No edema, no clubbing ,no  cyanosis Skin: No rashes, lesions or ulcers,no icterus ,no pallor   Data Reviewed: I have personally reviewed following labs and imaging studies  CBC: Recent Labs  Lab 08/12/20 1000 08/12/20 2030 08/13/20 0525  WBC 14.9*  --  12.6*  HGB 15.6 14.7 14.2  HCT 46.8  --  42.6  MCV 85.1  --  86.2  PLT 396  --  355   Basic Metabolic Panel: Recent Labs  Lab 08/12/20 1000 08/13/20 0525  NA 132* 136  K 3.6 3.6  CL 99 104  CO2 23 24  GLUCOSE 125* 81  BUN 17 14  CREATININE 0.67 0.76  CALCIUM 8.5* 8.5*   GFR: Estimated Creatinine Clearance: 197.8 mL/min (by C-G formula based on SCr of 0.76 mg/dL). Liver Function Tests: Recent Labs  Lab 08/12/20 1000 08/13/20 0525  AST 63* 22  ALT 166* 95*  ALKPHOS 103 88  BILITOT 1.5* 1.5*  PROT 7.5 6.6  ALBUMIN 4.2 3.5   Recent Labs  Lab 08/12/20 1000  LIPASE 344*   No results for input(s): AMMONIA in the last 168 hours. Coagulation Profile: No results for input(s): INR, PROTIME in the last 168 hours. Cardiac Enzymes: No results for input(s): CKTOTAL, CKMB, CKMBINDEX, TROPONINI in the last 168 hours. BNP (last 3 results) No results for input(s): PROBNP in the last 8760 hours. HbA1C: Recent Labs    08/12/20 1237  HGBA1C 5.5   CBG: No results for input(s): GLUCAP in the last 168 hours. Lipid Profile: Recent Labs    08/12/20 1237  CHOL 151  HDL 37*  LDLCALC 97  TRIG 85  CHOLHDL 4.1   Thyroid Function Tests: Recent Labs    08/12/20 1237  TSH 1.119   Anemia Panel: No results for input(s): VITAMINB12, FOLATE, FERRITIN, TIBC, IRON, RETICCTPCT in the last 72 hours. Sepsis Labs: No results for input(s): PROCALCITON, LATICACIDVEN in the last 168 hours.  Recent Results (from the past 240 hour(s))  Resp Panel by RT-PCR (Flu A&B, Covid) Nasopharyngeal Swab     Status: None   Collection Time: 08/12/20  3:06 PM   Specimen: Nasopharyngeal Swab; Nasopharyngeal(NP) swabs in vial transport medium  Result Value Ref Range  Status   SARS Coronavirus 2 by RT PCR NEGATIVE NEGATIVE Final    Comment: (NOTE) SARS-CoV-2 target nucleic acids are NOT DETECTED.  The SARS-CoV-2 RNA is generally detectable in upper respiratory specimens during the acute phase of infection. The lowest concentration of SARS-CoV-2 viral copies this assay can detect is 138 copies/mL. A negative result does not preclude SARS-Cov-2 infection and should not be used as the sole basis for treatment or other patient management decisions. A negative result may occur with  improper specimen collection/handling, submission of specimen other than nasopharyngeal swab, presence of viral mutation(s) within the areas targeted by this assay, and inadequate number of viral copies(<138 copies/mL). A negative result must be combined with clinical observations, patient history, and epidemiological information. The expected result is Negative.  Fact Sheet for Patients:  https://www.fda.gov/media/152166/download  Fact Sheet for Healthcare Providers:  SeriousBroker.ithttps://www.fda.gov/media/152162/download  This test is no t yet aBloggerCourse.compproved or cleared by the [place] FDA and  has been authorized for detection and/or diagnosis of SARS-CoV-2 by FDA under an Emergency Use Authorization (EUA). This EUA will remain  in effect (meaning this test can be used) for the duration of the COVID-19 declaration under Section 564(b)(1) of the Act, 21 U.S.C.section 360bbb-3(b)(1), unless the authorization is terminated  or revoked sooner.       Influenza A by PCR NEGATIVE NEGATIVE Final   Influenza B by PCR NEGATIVE NEGATIVE Final    Comment: (NOTE) The Xpert Xpress SARS-CoV-2/FLU/RSV plus assay is intended as an aid in the diagnosis of influenza from Nasopharyngeal swab specimens and should not be used as a sole basis for treatment. Nasal washings and aspirates are unacceptable for Xpert Xpress SARS-CoV-2/FLU/RSV testing.  Fact Sheet for  Patients: BloggerCourse.comhttps://www.fda.gov/media/152166/download  Fact Sheet for Healthcare Providers: SeriousBroker.ithttps://www.fda.gov/media/152162/download  This test is not yet approved or cleared by the [place] FDA and has been authorized for detection and/or diagnosis of SARS-CoV-2 by FDA under an Emergency Use Authorization (EUA). This EUA will remain in effect (meaning this test can be used) for the duration of the COVID-19 declaration under Section 564(b)(1) of the Act, 21 U.S.C. section 360bbb-3(b)(1), unless the authorization is terminated or revoked.  Performed at [facility], [address] [place], [place] [address]          Radiology Studies: NM Hepatobiliary Liver Func  Result Date: 08/12/2020 CLINICAL DATA:  Right upper quadrant pain pancreatitis EXAM: NUCLEAR MEDICINE HEPATOBILIARY IMAGING TECHNIQUE: Sequential images of the abdomen were obtained out to 60 minutes following intravenous administration of radiopharmaceutical. RADIOPHARMACEUTICALS:  4.981 mCi Tc-1867m  Choletec IV COMPARISON:  Ultrasound 08/12/2020, CT 08/12/2020 FINDINGS: Prompt uptake and biliary excretion of activity by the liver is seen. Gallbladder activity is visualized, consistent with patency of cystic duct. Biliary activity passes into small bowel, consistent with patent common bile duct. IMPRESSION: Negative examination Electronically Signed   By: Jasmine PangKim  Fujinaga M.D.   On: 08/12/2020 20:09   CT ABDOMEN PELVIS W CONTRAST  Result Date: 08/12/2020 CLINICAL DATA:  Nonlocalized acute abdominal pain. Two days of vomiting. Radiating to back. EXAM: CT ABDOMEN AND PELVIS WITH CONTRAST TECHNIQUE: Multidetector CT imaging of the abdomen and pelvis was performed using the standard protocol following bolus administration of intravenous contrast. CONTRAST:  100mL OMNIPAQUE IOHEXOL 300 MG/ML  SOLN COMPARISON:  Ultrasound abdomen 08/12/2020. FINDINGS: Lower chest: Bilateral lower lobe subsegmental atelectasis. No acute  abnormality. Hepatobiliary: No focal liver abnormality. No gallstones, gallbladder wall thickening, or pericholecystic fluid. Slight hazy and ill-defined of the gallbladder wall. No biliary dilatation. Pancreas: No focal lesion. Slightly hazy pancreatic contour distally with associated mild peripancreatic fat stranding. No main pancreatic ductal dilatation. No pseudocyst formation identified. Spleen: Measures at the upper limits of normal (13cm). No focal abnormality. Adrenals/Urinary Tract: No adrenal nodule bilaterally. Bilateral kidneys enhance symmetrically. There is a 2.2 cm fluid density lesion within the right kidney (2:46). No hydronephrosis. No hydroureter. On delayed imaging, there is no urothelial wall thickening and there are no filling defects in the opacified portions of the bilateral collecting systems or ureters. The urinary bladder is unremarkable. Stomach/Bowel: PO contrast reaching the ascending colon. Stomach is within normal limits. No evidence of bowel wall thickening or dilatation. The appendix not definitely identified. Vascular/Lymphatic: No abdominal aorta or iliac aneurysm. No abdominal, pelvic, or inguinal lymphadenopathy.  Reproductive: Prostate is unremarkable. Other: No intraperitoneal free fluid. No intraperitoneal free gas. No organized fluid collection. Musculoskeletal: Sub cutaneous soft tissue density and emphysema of the left anterior abdomen likely related to medication injection. No suspicious lytic or blastic osseous lesions. No acute displaced fracture. Multilevel degenerative changes of the spine. IMPRESSION: 1. Acute pancreatitis. No pseudocyst formation. Correlate with lipase levels. 2. Slightly ill-defined and hazy contour of the gallbladder wall. Given right upper quadrant ultrasound findings, recommend HIDA scan for further evaluation of possible acute cholecystitis. 3. Sub cutaneous soft tissue density and emphysema of the left anterior abdomen likely related to  medication injection. Please correlate clinically. Electronically Signed   By: Tish Frederickson M.D.   On: 08/12/2020 19:27   US ABDOMEN LIMITED RUQ (LIVER/GB)  Result Date: 08/12/2020 CLINICAL DATA:  Pain with nausea and vomiting EXAM: ULTRASOUND ABDOMEN LIMITED RIGHT UPPER QUADRANT COMPARISON:  None. FINDINGS: Gallbladder: Within the gallbladder, there are echogenic foci which move and shadow consistent with cholelithiasis. Largest gallstone measures 7 mm in length. Gallbladder wall thickness is upper normal. There is no pericholecystic fluid. No sonographic Murphy sign noted by sonographer. Common bile duct: Diameter: 3 mm. No intrahepatic or extrahepatic biliary duct dilatation. Liver: No focal lesion identified. Liver echogenicity overall increased. Portal vein is patent on color Doppler imaging with normal direction of blood flow towards the liver. Other: None. IMPRESSION: 1. Cholelithiasis. Gallbladder wall thickness upper normal. This circumstance may warrant nuclear medicine hepatobiliary imaging study to assess for cystic duct patency. 2. Increase in liver echogenicity, an appearance most likely indicative of hepatic steatosis. No focal liver lesions evident. Note that the sensitivity of ultrasound for detection of focal liver lesions is diminished given this degree of apparent underlying hepatic steatosis. Electronically Signed   By: Bretta Bang III M.D.   On: 08/12/2020 11:14        Scheduled Meds: . enoxaparin (LOVENOX) injection  60 mg Subcutaneous Q24H  . pantoprazole (PROTONIX) IV  40 mg Intravenous Q12H  . sodium chloride flush  3 mL Intravenous Q12H   Continuous Infusions: . lactated ringers 125 mL/hr at 08/13/20 1021     LOS: 0 days    Time spent: More than 50% of that time was spent in counseling and/or coordination of care.      Burnadette Pop, MD [facility] P12/29/2021, 10:23 AM

## 2020-08-13 NOTE — Consult Note (Addendum)
[facility] SURGICAL CONSULTATION NOTE (initial) - cpt: 01093   HISTORY OF PRESENT ILLNESS (HPI):  24 y.o. male presented to [facility] ED yesterday for evaluation of abdominal pain. Patient reports that he has had around 1 year of very sporadic RUQ abdominal pain which had typically resolved spontaneously. However, in the last 48 hours, his pain became more severe and did not resolve on its own as it had in the past. This was also accompanied by nausea and non-bloody, non-bilious emesis. No fever, chills, scleral icterus, CP, SOB, bowel changes, or urinary changes. No previous abdominal surgeries. He denied any significant history of alcohol abuse. Work up initially in the ED revealed a leukocytosis to 14.9k (now 12.6K), mild hyperbilirubinemia to 1.5, and lipase elevation to 344 (now 64). His RUQ was concerning for cholelithiasis without cholecystitis. He underwent subsequent HIDA scan which was normal. Follow up CT Abdomen/Pelvis did show mild pancreatitis. He was admitted to medicine service for further workup.   Surgery is consulted by hospitalist physician Dr. Burnadette Pop, MD in this context for evaluation and management of gallstone pancreatitis.   PAST MEDICAL HISTORY (PMH):  History reviewed. No pertinent past medical history.   PAST SURGICAL HISTORY (PSH):  History reviewed. No pertinent surgical history.   MEDICATIONS:  Prior to Admission medications   Not on File     ALLERGIES:  No Known Allergies   SOCIAL HISTORY:  Social History   Socioeconomic History   Marital status: Single    Spouse name: Not on file   Number of children: Not on file   Years of education: Not on file   Highest education level: Not on file  Occupational History   Not on file  Tobacco Use   Smoking status: Never Smoker   Smokeless tobacco: Not on file  Substance and Sexual Activity   Alcohol use: No   Drug use: No   Sexual activity: Not on file  Other Topics Concern   Not on  file  Social History Narrative   Not on file   Social Determinants of Health   Financial Resource Strain: Not on file  Food Insecurity: Not on file  Transportation Needs: Not on file  Physical Activity: Not on file  Stress: Not on file  Social Connections: Not on file  Intimate Partner Violence: Not on file     FAMILY HISTORY:  History reviewed. No pertinent family history.    REVIEW OF SYSTEMS:  Review of Systems  Constitutional: Negative for chills and fever.  HENT: Negative for congestion and sore throat.   Respiratory: Negative for cough and shortness of breath.   Cardiovascular: Negative for chest pain and palpitations.  Gastrointestinal: Positive for abdominal pain, nausea and vomiting. Negative for constipation and diarrhea.  Genitourinary: Negative for dysuria and urgency.  All other systems reviewed and are negative.   VITAL SIGNS:  Temp:  [97.7 F (36.5 C)-98.3 F (36.8 C)] 97.7 F (36.5 C) (12/29 0521) Pulse Rate:  [72-85] 73 (12/29 0521) Resp:  [18-20] 20 (12/29 0521) BP: (122-156)/(86-98) 122/86 (12/29 0521) SpO2:  [95 %-99 %] 95 % (12/29 0521)     Height: 6' (182.9 cm) Weight: 127 kg BMI (Calculated): 37.97   INTAKE/OUTPUT:  12/28 0701 - 12/29 0700 In: 1197.9 [I.V.:1197.9] Out: -   PHYSICAL EXAM:  Physical Exam Vitals and nursing note reviewed.  Constitutional:      General: He is not in acute distress.    Appearance: He is well-developed. He is obese. He is not  ill-appearing.  HENT:     Head: Normocephalic and atraumatic.  Eyes:     General: No scleral icterus.    Extraocular Movements: Extraocular movements intact.  Cardiovascular:     Rate and Rhythm: Normal rate and regular rhythm.     Heart sounds: Normal heart sounds. No murmur heard.   Pulmonary:     Effort: Pulmonary effort is normal. No respiratory distress.     Breath sounds: Normal breath sounds.  Abdominal:     General: There is no distension.     Palpations: Abdomen is soft.      Tenderness: There is no abdominal tenderness. There is no guarding or rebound. Negative signs include Murphy's sign.     Comments: Abdomen is obese, soft, no appreciable tenderness, non-distended, no rebound/guarding. Negative Murphy's sign   Genitourinary:    Comments: Deferred Skin:    General: Skin is warm and dry.     Coloration: Skin is not jaundiced or pale.  Neurological:     General: No focal deficit present.     Mental Status: He is alert and oriented to person, place, and time.  Psychiatric:        Mood and Affect: Mood normal.        Behavior: Behavior normal.      Labs:  CBC Latest Ref Rng & Units 08/13/2020 08/12/2020 08/12/2020  WBC 4.0 - 10.5 K/uL 12.6(H) - 14.9(H)  Hemoglobin 13.0 - 17.0 g/dL 77.8 24.2 35.3  Hematocrit 39.0 - 52.0 % 42.6 - 46.8  Platelets 150 - 400 K/uL 355 - 396   CMP Latest Ref Rng & Units 08/13/2020 08/12/2020  Glucose 70 - 99 mg/dL 81 614(E)  BUN 6 - 20 mg/dL 14 17  Creatinine 3.15 - 1.24 mg/dL 4.00 8.67  Sodium 619 - 145 mmol/L 136 132(L)  Potassium 3.5 - 5.1 mmol/L 3.6 3.6  Chloride 98 - 111 mmol/L 104 99  CO2 22 - 32 mmol/L 24 23  Calcium 8.9 - 10.3 mg/dL 5.0(D) 3.2(I)  Total Protein 6.5 - 8.1 g/dL 6.6 7.5  Total Bilirubin 0.3 - 1.2 mg/dL 7.1(I) 4.5(Y)  Alkaline Phos 38 - 126 U/L 88 103  AST 15 - 41 U/L 22 63(H)  ALT 0 - 44 U/L 95(H) 166(H)     Imaging studies:   RUQ [place] (08/12/2020) personally reviewed with cholelithiasis without evidence of cholecystitis and radiologist report reviewed:  IMPRESSION: 1. Cholelithiasis. Gallbladder wall thickness upper normal. This circumstance may warrant nuclear medicine hepatobiliary imaging study to assess for cystic duct patency.   2. Increase in liver echogenicity, an appearance most likely indicative of hepatic steatosis. No focal liver lesions evident. Note that the sensitivity of ultrasound for detection of focal liver lesions is diminished given this degree of apparent  underlying hepatic steatosis.   HIDA (08/12/2020) personally reviewed showing gallbladder filling and negative for cholecystitis, and radiologist report reviewed:  IMPRESSION: Negative examination    CT Abdomen/Pelvis (08/12/2020) personally reviewed inflammatory changes surrounding the pancreas concerning for pancreatitis, and radiologist report reviewed:  IMPRESSION: 1. Acute pancreatitis. No pseudocyst formation. Correlate with lipase levels. 2. Slightly ill-defined and hazy contour of the gallbladder wall. Given right upper quadrant ultrasound findings, recommend HIDA scan for further evaluation of possible acute cholecystitis. 3. Sub cutaneous soft tissue density and emphysema of the left anterior abdomen likely related to medication injection. Please correlate clinically.   Assessment/Plan: (ICD-10's: K85.10) 24 y.o. male with now improved lipase elevation and RUQ abdominal pain found to have pancreatitis, likely gallstone  pancreatitis, without cholecystitis   - We will plan for robotic assisted laparoscopic cholecystectomy this afternoon with Dr Lady Gary pending OR/Anesthesia availability  - All risks, benefits, and alternatives to above procedure(s) were discussed with the patient, all of his questions were answered to his expressed satisfaction, patient expresses he wishes to proceed, and informed consent was obtained.  - NPO; restarted IVF resuscitation   - IV Abx (Cefotetan); on-call to OR for prophylaxis  - Monitor abdominal examination  - Pain control prn; antiemetics prn  - Monitor lipase, LFTs, leukocytosis  - Hold DVT prophylaxis for OR  - Further management per primary service   All of the above findings and recommendations were discussed with the patient, and all of patient's questions were answered to his expressed satisfaction.  Thank you for the opportunity to participate in this patient's care.   -- Lynden Oxford, PA-C [facility] 08/13/2020, 11:03 AM [phone] M-F: 7am - 4pm  I saw and evaluated the patient.  I agree with the above documentation, exam, and plan, which I have edited where appropriate. Duanne Guess  11:49 AM

## 2020-08-13 NOTE — Transfer of Care (Signed)
Immediate Anesthesia Transfer of Care Note  Patient: Scott Floyd  Procedure(s) Performed: XI ROBOTIC ASSISTED LAPAROSCOPIC CHOLECYSTECTOMY (N/A ) INDOCYANINE GREEN FLUORESCENCE IMAGING (ICG)  Patient Location: PACU  Anesthesia Type:General  Level of Consciousness: drowsy and patient cooperative  Airway & Oxygen Therapy: Patient Spontanous Breathing  Post-op Assessment: Report given to RN and Post -op Vital signs reviewed and stable  Post vital signs: Reviewed and stable  Last Vitals:  Vitals Value Taken Time  BP 154/100 08/13/20 1451  Temp 36.3 C 08/13/20 1451  Pulse 99 08/13/20 1453  Resp 17 08/13/20 1452  SpO2 100 % 08/13/20 1453  Vitals shown include unvalidated device data.  Last Pain:  Vitals:   08/13/20 1206  TempSrc: Temporal  PainSc: 5       Patients Stated Pain Goal: 0 (08/13/20 1206)  Complications: No complications documented.

## 2020-08-14 DIAGNOSIS — K760 Fatty (change of) liver, not elsewhere classified: Secondary | ICD-10-CM | POA: Diagnosis present

## 2020-08-14 DIAGNOSIS — Z6837 Body mass index (BMI) 37.0-37.9, adult: Secondary | ICD-10-CM | POA: Diagnosis not present

## 2020-08-14 DIAGNOSIS — L03311 Cellulitis of abdominal wall: Secondary | ICD-10-CM | POA: Diagnosis not present

## 2020-08-14 DIAGNOSIS — I10 Essential (primary) hypertension: Secondary | ICD-10-CM | POA: Diagnosis present

## 2020-08-14 DIAGNOSIS — T8149XA Infection following a procedure, other surgical site, initial encounter: Secondary | ICD-10-CM | POA: Diagnosis not present

## 2020-08-14 DIAGNOSIS — K8020 Calculus of gallbladder without cholecystitis without obstruction: Secondary | ICD-10-CM | POA: Diagnosis present

## 2020-08-14 DIAGNOSIS — E669 Obesity, unspecified: Secondary | ICD-10-CM | POA: Diagnosis present

## 2020-08-14 DIAGNOSIS — K851 Biliary acute pancreatitis without necrosis or infection: Secondary | ICD-10-CM | POA: Diagnosis present

## 2020-08-14 DIAGNOSIS — Z833 Family history of diabetes mellitus: Secondary | ICD-10-CM | POA: Diagnosis not present

## 2020-08-14 DIAGNOSIS — K920 Hematemesis: Secondary | ICD-10-CM | POA: Diagnosis present

## 2020-08-14 DIAGNOSIS — Z8249 Family history of ischemic heart disease and other diseases of the circulatory system: Secondary | ICD-10-CM | POA: Diagnosis not present

## 2020-08-14 DIAGNOSIS — Y838 Other surgical procedures as the cause of abnormal reaction of the patient, or of later complication, without mention of misadventure at the time of the procedure: Secondary | ICD-10-CM | POA: Diagnosis not present

## 2020-08-14 DIAGNOSIS — Z20822 Contact with and (suspected) exposure to covid-19: Secondary | ICD-10-CM | POA: Diagnosis present

## 2020-08-14 LAB — COMPREHENSIVE METABOLIC PANEL
ALT: 86 U/L — ABNORMAL HIGH (ref 0–44)
AST: 36 U/L (ref 15–41)
Albumin: 3.8 g/dL (ref 3.5–5.0)
Alkaline Phosphatase: 87 U/L (ref 38–126)
Anion gap: 9 (ref 5–15)
BUN: 12 mg/dL (ref 6–20)
CO2: 26 mmol/L (ref 22–32)
Calcium: 9 mg/dL (ref 8.9–10.3)
Chloride: 101 mmol/L (ref 98–111)
Creatinine, Ser: 0.79 mg/dL (ref 0.61–1.24)
GFR, Estimated: >60 mL/min (ref >60)
Glucose, Bld: 106 mg/dL — ABNORMAL HIGH (ref 70–99)
Potassium: 4.3 mmol/L (ref 3.5–5.1)
Sodium: 136 mmol/L (ref 135–145)
Total Bilirubin: 0.9 mg/dL (ref 0.3–1.2)
Total Protein: 7.3 g/dL (ref 6.5–8.1)

## 2020-08-14 LAB — LIPASE, BLOOD: Lipase: 30 U/L (ref 11–51)

## 2020-08-14 MED ORDER — IBUPROFEN 800 MG PO TABS: 800.0000 mg | ORAL_TABLET | Freq: Four times a day (QID) | ORAL | 0 refills | Status: DC | PRN

## 2020-08-14 MED ORDER — HYDROCODONE-ACETAMINOPHEN 5-325 MG PO TABS: 1.0000 | ORAL_TABLET | ORAL | 0 refills | Status: DC | PRN

## 2020-08-14 NOTE — Consult Note (Signed)
[facility] SURGICAL CONSULTATION NOTE    HISTORY OF PRESENT ILLNESS (HPI):  24 y.o. male presented to [facility] ED yesterday for evaluation of abdominal pain. Patient reports that he has had around 1 year of very sporadic RUQ abdominal pain which had typically resolved spontaneously. However, in the last 48 hours, his pain became more severe and did not resolve on its own as it had in the past. This was also accompanied by nausea and non-bloody, non-bilious emesis. No fever, chills, scleral icterus, CP, SOB, bowel changes, or urinary changes. No previous abdominal surgeries. He denied any significant history of alcohol abuse. Work up initially in the ED revealed a leukocytosis to 14.9k (now 12.6K), mild hyperbilirubinemia to 1.5, and lipase elevation to 344 (now 64). His RUQ was concerning for cholelithiasis without cholecystitis. He underwent subsequent HIDA scan which was normal. Follow up CT Abdomen/Pelvis did show mild pancreatitis. He was admitted to medicine service for further workup.   Surgery is consulted by hospitalist physician Dr. Burnadette Pop, MD in this context for evaluation and management of gallstone pancreatitis.  INTERVAL HISTORY (08/13/20): uncomplicated robot-assisted lap chole performed yesterday.  Patient has only had clears, so far.  No nausea.  Pain controlled with current regimen. Bilirubin improved, lipase WNL.  PAST MEDICAL HISTORY (PMH):  History reviewed. No pertinent past medical history.   PAST SURGICAL HISTORY (PSH):  History reviewed. No pertinent surgical history.   MEDICATIONS:  Prior to Admission medications   Not on File     ALLERGIES:  No Known Allergies   SOCIAL HISTORY:  Social History   Socioeconomic History  . Marital status: Single    Spouse name: Not on file  . Number of children: Not on file  . Years of education: Not on file  . Highest education level: Not on file  Occupational History  . Not on file  Tobacco Use  .  Smoking status: Never Smoker  . Smokeless tobacco: Not on file  Substance and Sexual Activity  . Alcohol use: No  . Drug use: No  . Sexual activity: Not on file  Other Topics Concern  . Not on file  Social History Narrative  . Not on file   Social Determinants of Health   Financial Resource Strain: Not on file  Food Insecurity: Not on file  Transportation Needs: Not on file  Physical Activity: Not on file  Stress: Not on file  Social Connections: Not on file  Intimate Partner Violence: Not on file     FAMILY HISTORY:  History reviewed. No pertinent family history.    REVIEW OF SYSTEMS:  Review of Systems  Constitutional: Negative for chills and fever.  HENT: Negative for congestion and sore throat.   Respiratory: Negative for cough and shortness of breath.   Cardiovascular: Negative for chest pain and palpitations.  Gastrointestinal: Negative for abdominal pain, constipation, diarrhea, nausea and vomiting.  Genitourinary: Negative for dysuria and urgency.  All other systems reviewed and are negative.   VITAL SIGNS:  Temp:  [97.3 F (36.3 C)-98.4 F (36.9 C)] 97.5 F (36.4 C) (12/30 0833) Pulse Rate:  [66-102] 78 (12/30 0833) Resp:  [13-19] 16 (12/30 0343) BP: (124-157)/(71-110) 156/93 (12/30 0833) SpO2:  [94 %-100 %] 97 % (12/30 0833) Weight:  [129.3 kg] 129.3 kg (12/29 1206)     Height: 6' (182.9 cm) Weight: 129.3 kg BMI (Calculated): 38.64   INTAKE/OUTPUT:  12/29 0701 - 12/30 0700 In: 2576 [P.O.:720; I.V.:1756; IV Piggyback:100] Out: -   PHYSICAL EXAM:  Physical Exam Vitals and nursing note reviewed.  Constitutional:      General: He is not in acute distress.    Appearance: He is well-developed. He is obese. He is not ill-appearing.  HENT:     Head: Normocephalic and atraumatic.  Eyes:     General: No scleral icterus.    Extraocular Movements: Extraocular movements intact.  Cardiovascular:     Rate and Rhythm: Normal rate and regular rhythm.      Heart sounds: Normal heart sounds. No murmur heard.   Pulmonary:     Effort: Pulmonary effort is normal. No respiratory distress.     Breath sounds: Normal breath sounds.  Abdominal:     General: There is no distension.     Palpations: Abdomen is soft.     Tenderness: There is no abdominal tenderness. There is no guarding or rebound. Negative signs include Murphy's sign.     Comments: Abdomen is obese, soft, no appreciable tenderness, non-distended, no rebound/guarding. Negative Murphy's sign   Genitourinary:    Comments: Deferred Skin:    General: Skin is warm and dry.     Coloration: Skin is not jaundiced or pale.  Neurological:     General: No focal deficit present.     Mental Status: He is alert and oriented to person, place, and time.  Psychiatric:        Mood and Affect: Mood normal.        Behavior: Behavior normal.      Labs:  CBC Latest Ref Rng & Units 08/13/2020 08/12/2020 08/12/2020  WBC 4.0 - 10.5 K/uL 12.6(H) - 14.9(H)  Hemoglobin 13.0 - 17.0 g/dL 31.4 97.0 26.3  Hematocrit 39.0 - 52.0 % 42.6 - 46.8  Platelets 150 - 400 K/uL 355 - 396   CMP Latest Ref Rng & Units 08/14/2020 08/13/2020 08/12/2020  Glucose 70 - 99 mg/dL 785(Y) 81 850(Y)  BUN 6 - 20 mg/dL 12 14 17   Creatinine 0.61 - 1.24 mg/dL 7.74 1.28  Sodium 135 - 145 mmol/L 136 136 132(L)  Potassium 3.5 - 5.1 mmol/L 4.3 3.6 3.6  Chloride 98 - 111 mmol/L 101 104 99  CO2 22 - 32 mmol/L 26 24 23   Calcium 8.9 - 10.3 mg/dL 9.0 7.86) )  Total Protein 6.5 - 8.1 g/dL 7.3 6.6 7.5  Total Bilirubin 0.3 - 1.2 mg/dL 0.9 7.6(H) 2.0(N)  Alkaline Phos 38 - 126 U/L 87 88 103  AST 15 - 41 U/L 36 22 63(H)  ALT 0 - 44 U/L 86(H) 95(H) 166(H)     Imaging studies:   RUQ 4.7(S (08/12/2020) personally reviewed with cholelithiasis without evidence of cholecystitis and radiologist report reviewed:  IMPRESSION: 1. Cholelithiasis. Gallbladder wall thickness upper normal. This circumstance may warrant nuclear medicine  hepatobiliary imaging study to assess for cystic duct patency.   2. Increase in liver echogenicity, an appearance most likely indicative of hepatic steatosis. No focal liver lesions evident. Note that the sensitivity of ultrasound for detection of focal liver lesions is diminished given this degree of apparent underlying hepatic steatosis.   HIDA (08/12/2020) personally reviewed showing gallbladder filling and negative for cholecystitis, and radiologist report reviewed:  IMPRESSION: Negative examination    CT Abdomen/Pelvis (08/12/2020) personally reviewed inflammatory changes surrounding the pancreas concerning for pancreatitis, and radiologist report reviewed:  IMPRESSION: 1. Acute pancreatitis. No pseudocyst formation. Correlate with lipase levels. 2. Slightly ill-defined and hazy contour of the gallbladder wall. Given right upper quadrant ultrasound findings, recommend HIDA scan for further evaluation  of possible acute cholecystitis. 3. Sub cutaneous soft tissue density and emphysema of the left anterior abdomen likely related to medication injection. Please correlate clinically.   Assessment/Plan: (ICD-10's: K85.10) 24 y.o. male with now improved lipase elevation and RUQ abdominal pain found to have pancreatitis, likely gallstone pancreatitis, without cholecystitis  Now POD 1 from robot-assisted lap chole. Doing well. Will advance diet.  Can likely d/c home later today. Pain Rx on chart, along with follow-up (2 weeks in general surgery clinic with PA-C)  Plan discussed with patient and with Dr. Renford Dills.

## 2020-08-14 NOTE — Progress Notes (Signed)
PROGRESS NOTE    Scott Floyd  JOA:[id number] DOB: 1996-08-14 DOA: 08/12/2020 PCP: Patient, No Pcp Per   Chief Complain: Abdominal pain  Brief Narrative:  Patient is a 24 year old male with history of obesity, who presented to the emergency department with complaints of epigastric pain for 2 days.  He has been having the symptoms intermittently since few months.  Pain gets worse with eating fatty food, dairy.  His pain usually starts at right upper quadrant, radiates to the epigastric region and back.  Patient also reported nausea and vomiting and an episode of coffee-ground emesis but no report of dark stools.  On presentation he was mildly hypertensive.  He was found to have elevated liver enzymes, elevated lipase.  Patient was admitted for the management of acute pancreatitis.He underwent laparoscopic cholecystectomy on 08/13/2020.  Assessment & Plan:   Active Problems:   Acute pancreatitis   Acute gallstone pancreatitis   Acute pancreatitis: Liver enzymes, lipase were elevated on presentation but has  normalized now.  Ultrasound of right upper quadrant  showed  gallstones.  HIDA scan negative for cholecystitis.  CT abdomen/pelvis showed no dilation of CBD.  Patient has been having recurrent symptoms in the past with right upper quadrant pain mainly precipitated with fatty food.  Symptoms consistent with biliary colic.  Imaging show hepatic steatosis Lipid panel shows normal triglyceride.  Patient does not drink alcohol. Likely biliary  Pancreatitis. He underwent laparoscopic cholecystectomy on 08/13/2020.Diet advanced  Episode of hematemesis: No active bleeding at present.  Hemoglobin stable.  Continue PPI  Elevated liver enzymes: Improved.  He might have passed stone or something.  No dilation of CBD seen on imaging  Hypertension: Currently blood stable.  Does not take any medications at home.  Obesity: BMI 37.9.  Leukocytosis: Improved          DVT  prophylaxis:Lovenox Code Status: Full Family Communication: None at bedside Status is: Observation  The patient remains OBS appropriate and will d/c before 2 midnights.  Dispo: The patient is from: Home              Anticipated d/c is to: Home              Anticipated d/c date is: 1 day              Patient currently is not medically stable to d/c. Patient has not tolerated the advance diet yet.  He does not feel comfortable to go home today.   Consultants: General surgery Procedures: None  Antimicrobials:  Anti-infectives (From admission, onward)   Start     Dose/Rate Route Frequency Ordered Stop   08/13/20 1202  sodium chloride 0.9 % with cefoTEtan (CEFOTAN) ADS Med       Note to Pharmacy: Register, Karen   : cabinet override      08/13/20 1202 08/13/20 1243   08/13/20 1200  cefoTEtan (CEFOTAN) 2 g in sodium chloride 0.9 % 100 mL IVPB        2 g 200 mL/hr over 30 Minutes Intravenous On call to O.R. 08/13/20 1102 08/13/20 1313      Subjective: Patient seen and examined at the bedside this morning in afternoon.  Hemodynamically stable. Little hesitant to start on advanced solid diet.  Tolerating  liquid diet.  Not comfortable for going home today  Objective: Vitals:   08/14/20 0106 08/14/20 0343 08/14/20 0833 08/14/20 1145  BP: (!) 145/93 124/71 (!) 156/93 (!) 143/84  Pulse: 87 72 78 74  Resp: 19 16  20  Temp: 97.7 F (36.5 C) 98.4 F (36.9 C) (!) 97.5 F (36.4 C) 97.8 F (36.6 C)  TempSrc: Oral Oral Oral Oral  SpO2: 97% 97% 97% 99%  Weight:      Height:        Intake/Output Summary (Last 24 hours) at 08/14/2020 1407 Last data filed at 08/14/2020 0319 Gross per 24 hour  Intake 2236 ml  Output --  Net 2236 ml   Filed Weights   08/12/20 0943 08/13/20 1206  Weight: 127 kg 129.3 kg    Examination:  General exam: Appears calm and comfortable ,Not in distress,obese HEENT:PERRL,Oral mucosa moist, Ear/Nose normal on gross exam Respiratory system: Bilateral  equal air entry, normal vesicular breath sounds, no wheezes or crackles  Cardiovascular system: S1 & S2 heard, RRR. No JVD, murmurs, rubs, gallops or clicks. Gastrointestinal system: Abdomen is nondistended, soft and nontender. No organomegaly or masses felt. Normal bowel sounds heard.Clean surgical wounds Central nervous system: Alert and oriented. No focal neurological deficits. Extremities: No edema, no clubbing ,no cyanosis Skin: No rashes, lesions or ulcers,no icterus ,no pallor  Data Reviewed: I have personally reviewed following labs and imaging studies  CBC: Recent Labs  Lab 08/12/20 1000 08/12/20 2030 08/13/20 0525  WBC 14.9*  --  12.6*  HGB 15.6 14.7 14.2  HCT 46.8  --  42.6  MCV 85.1  --  86.2  PLT 396  --  355   Basic Metabolic Panel: Recent Labs  Lab 08/12/20 1000 08/13/20 0525 08/14/20 0635  NA 132* 136 136  K 3.6 3.6 4.3  CL 99 104 101  CO2 23 24 26   GLUCOSE 125* 81 106*  BUN 17 14 12   CREATININE 0.67 0.76 0.79  CALCIUM 8.5* 8.5* 9.0   GFR: Estimated Creatinine Clearance: 199.7 mL/min (by C-G formula based on SCr of 0.79 mg/dL). Liver Function Tests: Recent Labs  Lab 08/12/20 1000 08/13/20 0525 08/14/20 0635  AST 63* 22 36  ALT 166* 95* 86*  ALKPHOS 103 88 87  BILITOT 1.5* 1.5* 0.9  PROT 7.5 6.6 7.3  ALBUMIN 4.2 3.5 3.8   Recent Labs  Lab 08/12/20 1000 08/13/20 0525 08/14/20 0635  LIPASE 344* 64* 30   No results for input(s): AMMONIA in the last 168 hours. Coagulation Profile: No results for input(s): INR, PROTIME in the last 168 hours. Cardiac Enzymes: No results for input(s): CKTOTAL, CKMB, CKMBINDEX, TROPONINI in the last 168 hours. BNP (last 3 results) No results for input(s): PROBNP in the last 8760 hours. HbA1C: Recent Labs    08/12/20 1237  HGBA1C 5.5   CBG: No results for input(s): GLUCAP in the last 168 hours. Lipid Profile: Recent Labs    08/12/20 1237  CHOL 151  HDL 37*  LDLCALC 97  TRIG 85  CHOLHDL 4.1    Thyroid Function Tests: Recent Labs    08/12/20 1237  TSH 1.119   Anemia Panel: No results for input(s): VITAMINB12, FOLATE, FERRITIN, TIBC, IRON, RETICCTPCT in the last 72 hours. Sepsis Labs: No results for input(s): PROCALCITON, LATICACIDVEN in the last 168 hours.  Recent Results (from the past 240 hour(s))  Resp Panel by RT-PCR (Flu A&B, Covid) Nasopharyngeal Swab     Status: None   Collection Time: 08/12/20  3:06 PM   Specimen: Nasopharyngeal Swab; Nasopharyngeal(NP) swabs in vial transport medium  Result Value Ref Range Status   SARS Coronavirus 2 by RT PCR NEGATIVE NEGATIVE Final    Comment: (NOTE) SARS-CoV-2 target nucleic acids are NOT DETECTED.  The SARS-CoV-2 RNA is generally detectable in upper respiratory specimens during the acute phase of infection. The lowest concentration of SARS-CoV-2 viral copies this assay can detect is 138 copies/mL. A negative result does not preclude SARS-Cov-2 infection and should not be used as the sole basis for treatment or other patient management decisions. A negative result may occur with  improper specimen collection/handling, submission of specimen other than nasopharyngeal swab, presence of viral mutation(s) within the areas targeted by this assay, and inadequate number of viral copies(<138 copies/mL). A negative result must be combined with clinical observations, patient history, and epidemiological information. The expected result is Negative.  Fact Sheet for Patients:  BloggerCourse.com  Fact Sheet for Healthcare Providers:  SeriousBroker.it  This test is no t yet approved or cleared by the [place] FDA and  has been authorized for detection and/or diagnosis of SARS-CoV-2 by FDA under an Emergency Use Authorization (EUA). This EUA will remain  in effect (meaning this test can be used) for the duration of the COVID-19 declaration under Section 564(b)(1) of the Act,  21 U.S.C.section 360bbb-3(b)(1), unless the authorization is terminated  or revoked sooner.       Influenza A by PCR NEGATIVE NEGATIVE Final   Influenza B by PCR NEGATIVE NEGATIVE Final    Comment: (NOTE) The Xpert Xpress SARS-CoV-2/FLU/RSV plus assay is intended as an aid in the diagnosis of influenza from Nasopharyngeal swab specimens and should not be used as a sole basis for treatment. Nasal washings and aspirates are unacceptable for Xpert Xpress SARS-CoV-2/FLU/RSV testing.  Fact Sheet for Patients: BloggerCourse.com  Fact Sheet for Healthcare Providers: SeriousBroker.it  This test is not yet approved or cleared by the [place] FDA and has been authorized for detection and/or diagnosis of SARS-CoV-2 by FDA under an Emergency Use Authorization (EUA). This EUA will remain in effect (meaning this test can be used) for the duration of the COVID-19 declaration under Section 564(b)(1) of the Act, 21 U.S.C. section 360bbb-3(b)(1), unless the authorization is terminated or revoked.  Performed at [facility], [address] [place], [place] [address]          Radiology Studies: NM Hepatobiliary Liver Func  Result Date: 08/12/2020 CLINICAL DATA:  Right upper quadrant pain pancreatitis EXAM: NUCLEAR MEDICINE HEPATOBILIARY IMAGING TECHNIQUE: Sequential images of the abdomen were obtained out to 60 minutes following intravenous administration of radiopharmaceutical. RADIOPHARMACEUTICALS:  4.981 mCi Tc-56m  Choletec IV COMPARISON:  Ultrasound 08/12/2020, CT 08/12/2020 FINDINGS: Prompt uptake and biliary excretion of activity by the liver is seen. Gallbladder activity is visualized, consistent with patency of cystic duct. Biliary activity passes into small bowel, consistent with patent common bile duct. IMPRESSION: Negative examination Electronically Signed   By: Jasmine Pang M.D.   On: 08/12/2020 20:09   CT ABDOMEN  PELVIS W CONTRAST  Result Date: 08/12/2020 CLINICAL DATA:  Nonlocalized acute abdominal pain. Two days of vomiting. Radiating to back. EXAM: CT ABDOMEN AND PELVIS WITH CONTRAST TECHNIQUE: Multidetector CT imaging of the abdomen and pelvis was performed using the standard protocol following bolus administration of intravenous contrast. CONTRAST:  OMNIPAQUE IOHEXOL 300 MG/ML  SOLN COMPARISON:  Ultrasound abdomen 08/12/2020. FINDINGS: Lower chest: Bilateral lower lobe subsegmental atelectasis. No acute abnormality. Hepatobiliary: No focal liver abnormality. No gallstones, gallbladder wall thickening, or pericholecystic fluid. Slight hazy and ill-defined of the gallbladder wall. No biliary dilatation. Pancreas: No focal lesion. Slightly hazy pancreatic contour distally with associated mild peripancreatic fat stranding. No main pancreatic ductal dilatation. No pseudocyst formation  identified. Spleen: Measures at the upper limits of normal (13cm). No focal abnormality. Adrenals/Urinary Tract: No adrenal nodule bilaterally. Bilateral kidneys enhance symmetrically. There is a 2.2 cm fluid density lesion within the right kidney (2:46). No hydronephrosis. No hydroureter. On delayed imaging, there is no urothelial wall thickening and there are no filling defects in the opacified portions of the bilateral collecting systems or ureters. The urinary bladder is unremarkable. Stomach/Bowel: PO contrast reaching the ascending colon. Stomach is within normal limits. No evidence of bowel wall thickening or dilatation. The appendix not definitely identified. Vascular/Lymphatic: No abdominal aorta or iliac aneurysm. No abdominal, pelvic, or inguinal lymphadenopathy. Reproductive: Prostate is unremarkable. Other: No intraperitoneal free fluid. No intraperitoneal free gas. No organized fluid collection. Musculoskeletal: Sub cutaneous soft tissue density and emphysema of the left anterior abdomen likely related to medication  injection. No suspicious lytic or blastic osseous lesions. No acute displaced fracture. Multilevel degenerative changes of the spine. IMPRESSION: 1. Acute pancreatitis. No pseudocyst formation. Correlate with lipase levels. 2. Slightly ill-defined and hazy contour of the gallbladder wall. Given right upper quadrant ultrasound findings, recommend HIDA scan for further evaluation of possible acute cholecystitis. 3. Sub cutaneous soft tissue density and emphysema of the left anterior abdomen likely related to medication injection. Please correlate clinically. Electronically Signed   By: Tish Frederickson M.D.   On: 08/12/2020 19:27        Scheduled Meds: . acetaminophen  1,000 mg Oral Q6H  . pantoprazole (PROTONIX) IV  40 mg Intravenous Q12H  . sodium chloride flush  3 mL Intravenous Q12H   Continuous Infusions: . lactated ringers 125 mL/hr at 08/14/20 1332     LOS: 0 days    Time spent: 35 mins.,More than 50% of that time was spent in counseling and/or coordination of care.      Burnadette Pop, MD [facility] P12/30/2021, 2:07 PM

## 2020-08-15 DIAGNOSIS — K851 Biliary acute pancreatitis without necrosis or infection: Secondary | ICD-10-CM | POA: Diagnosis not present

## 2020-08-15 MED ORDER — AMOXICILLIN-POT CLAVULANATE 875-125 MG PO TABS: 1.0000 | ORAL_TABLET | Freq: Two times a day (BID) | ORAL | 0 refills | Status: AC

## 2020-08-15 NOTE — Discharge Summary (Signed)
Physician Discharge Summary  Scott Floyd UXN:[id number] DOB: Apr 13, 1996 DOA: 08/12/2020  PCP: Scott Floyd, No Pcp Per  Admit date: 08/12/2020 Discharge date: 08/15/2020  Admitted From: Home Disposition:  Home  Discharge Condition:Stable CODE STATUS:FULL Diet recommendation:  Regular  Brief/Interim Summary:  Scott Floyd is a 24 year old male with history of obesity, who presented to the emergency department with complaints of epigastric pain for 2 days.  He has been having the symptoms intermittently since few months.  Pain gets worse with eating fatty food, dairy.  His pain usually starts at right upper quadrant, radiates to the epigastric region and back.  Scott Floyd also reported nausea and vomiting and an episode of coffee-ground emesis but no report of dark stools.  On presentation he was mildly hypertensive.  He was found to have elevated liver enzymes, elevated lipase.  Scott Floyd was admitted for the management of acute pancreatitis.He underwent laparoscopic cholecystectomy on 08/13/2020.currently his pain is well controlled.  There was some local cellulitis changes around one of the incision sites, started on Augmentin.  He is tolerating diet.  He is  medically stable for discharge to home today.  He will follow-up with general surgery as an outpatient.   Discharge Diagnoses:  Active Problems:   Acute pancreatitis   Acute gallstone pancreatitis    Discharge Instructions  Discharge Instructions    Diet general   Complete by: As directed    Discharge instructions   Complete by: As directed    1)Follow up with general surgery as an outpatient   Increase activity slowly   Complete by: As directed    No wound care   Complete by: As directed      Allergies as of 08/15/2020   No Known Allergies     Medication List    TAKE these medications   amoxicillin-clavulanate 875-125 MG tablet Commonly known as: Augmentin Take 1 tablet by mouth 2 (two) times daily for 7 days.    HYDROcodone-acetaminophen 5-325 MG tablet Commonly known as: NORCO/VICODIN Take 1 tablet by mouth every 4 (four) hours as needed for moderate pain.   ibuprofen 800 MG tablet Commonly known as: ADVIL Take 1 tablet (800 mg total) by mouth every 6 (six) hours as needed for fever, mild pain or moderate pain.       Follow-up Information    Donovan Kail, PA-C. Schedule an appointment as soon as possible for a visit in 2 week(s).   Specialty: Physician Assistant Why: post-op Contact information: [address] [place] [address] [phone]              No Known Allergies  Consultations: Surgery  Procedures/Studies: NM Hepatobiliary Liver Func  Result Date: 08/12/2020 CLINICAL DATA:  Right upper quadrant pain pancreatitis EXAM: NUCLEAR MEDICINE HEPATOBILIARY IMAGING TECHNIQUE: Sequential images of the abdomen were obtained out to 60 minutes following intravenous administration of radiopharmaceutical. RADIOPHARMACEUTICALS:  4.981 mCi Tc-32m  Choletec IV COMPARISON:  Ultrasound 08/12/2020, CT 08/12/2020 FINDINGS: Prompt uptake and biliary excretion of activity by the liver is seen. Gallbladder activity is visualized, consistent with patency of cystic duct. Biliary activity passes into small bowel, consistent with patent common bile duct. IMPRESSION: Negative examination Electronically Signed   By: Jasmine Pang M.D.   On: 08/12/2020 20:09   CT ABDOMEN PELVIS W CONTRAST  Result Date: 08/12/2020 CLINICAL DATA:  Nonlocalized acute abdominal pain. Two days of vomiting. Radiating to back. EXAM: CT ABDOMEN AND PELVIS WITH CONTRAST TECHNIQUE: Multidetector CT imaging of the abdomen and pelvis was performed using  the standard protocol following bolus administration of intravenous contrast. CONTRAST:  OMNIPAQUE IOHEXOL 300 MG/ML  SOLN COMPARISON:  Ultrasound abdomen 08/12/2020. FINDINGS: Lower chest: Bilateral lower lobe subsegmental atelectasis. No acute abnormality.  Hepatobiliary: No focal liver abnormality. No gallstones, gallbladder wall thickening, or pericholecystic fluid. Slight hazy and ill-defined of the gallbladder wall. No biliary dilatation. Pancreas: No focal lesion. Slightly hazy pancreatic contour distally with associated mild peripancreatic fat stranding. No main pancreatic ductal dilatation. No pseudocyst formation identified. Spleen: Measures at the upper limits of normal (13cm). No focal abnormality. Adrenals/Urinary Tract: No adrenal nodule bilaterally. Bilateral kidneys enhance symmetrically. There is a 2.2 cm fluid density lesion within the right kidney (2:46). No hydronephrosis. No hydroureter. On delayed imaging, there is no urothelial wall thickening and there are no filling defects in the opacified portions of the bilateral collecting systems or ureters. The urinary bladder is unremarkable. Stomach/Bowel: PO contrast reaching the ascending colon. Stomach is within normal limits. No evidence of bowel wall thickening or dilatation. The appendix not definitely identified. Vascular/Lymphatic: No abdominal aorta or iliac aneurysm. No abdominal, pelvic, or inguinal lymphadenopathy. Reproductive: Prostate is unremarkable. Other: No intraperitoneal free fluid. No intraperitoneal free gas. No organized fluid collection. Musculoskeletal: Sub cutaneous soft tissue density and emphysema of the left anterior abdomen likely related to medication injection. No suspicious lytic or blastic osseous lesions. No acute displaced fracture. Multilevel degenerative changes of the spine. IMPRESSION: 1. Acute pancreatitis. No pseudocyst formation. Correlate with lipase levels. 2. Slightly ill-defined and hazy contour of the gallbladder wall. Given right upper quadrant ultrasound findings, recommend HIDA scan for further evaluation of possible acute cholecystitis. 3. Sub cutaneous soft tissue density and emphysema of the left anterior abdomen likely related to medication  injection. Please correlate clinically. Electronically Signed   By: Tish Frederickson M.D.   On: 08/12/2020 19:27   US ABDOMEN LIMITED RUQ (LIVER/GB)  Result Date: 08/12/2020 CLINICAL DATA:  Pain with nausea and vomiting EXAM: ULTRASOUND ABDOMEN LIMITED RIGHT UPPER QUADRANT COMPARISON:  None. FINDINGS: Gallbladder: Within the gallbladder, there are echogenic foci which move and shadow consistent with cholelithiasis. Largest gallstone measures 7 mm in length. Gallbladder wall thickness is upper normal. There is no pericholecystic fluid. No sonographic Murphy sign noted by sonographer. Common bile duct: Diameter: 3 mm. No intrahepatic or extrahepatic biliary duct dilatation. Liver: No focal lesion identified. Liver echogenicity overall increased. Portal vein is patent on color Doppler imaging with normal direction of blood flow towards the liver. Other: None. IMPRESSION: 1. Cholelithiasis. Gallbladder wall thickness upper normal. This circumstance may warrant nuclear medicine hepatobiliary imaging study to assess for cystic duct patency. 2. Increase in liver echogenicity, an appearance most likely indicative of hepatic steatosis. No focal liver lesions evident. Note that the sensitivity of ultrasound for detection of focal liver lesions is diminished given this degree of apparent underlying hepatic steatosis. Electronically Signed   By: Bretta Bang III M.D.   On: 08/12/2020 11:14       Subjective: Scott Floyd seen and examined at the bedside this morning.  Medically stable for discharge  Discharge Exam: Vitals:   08/15/20 0809 08/15/20 1124  BP: (!) 138/98 (!) 154/92  Pulse: 77 65  Resp:  15  Temp: 97.8 F (36.6 C) 97.8 F (36.6 C)  SpO2: 97% 98%   Vitals:   08/15/20 0102 08/15/20 0527 08/15/20 0809 08/15/20 1124  BP: 130/85 136/90 (!) 138/98 (!) 154/92  Pulse: 78 73 77 65  Resp: 20 18  15   Temp: 98  F (36.7 C) 97.8 F (36.6 C) 97.8 F (36.6 C) 97.8 F (36.6 C)  TempSrc: Oral Oral  Oral Oral  SpO2: 100% 99% 97% 98%  Weight:      Height:        General: Pt is alert, awake, not in acute distress Cardiovascular: RRR, S1/S2 +, no rubs, no gallops Respiratory: CTA bilaterally, no wheezing, no rhonchi Abdominal: Soft, NT, ND, bowel sounds +, erythematous changes on one of the incision sites Extremities: no edema, no cyanosis    The results of significant diagnostics from this hospitalization (including imaging, microbiology, ancillary and laboratory) are listed below for reference.     Microbiology: Recent Results (from the past 240 hour(s))  Resp Panel by RT-PCR (Flu A&B, Covid) Nasopharyngeal Swab     Status: None   Collection Time: 08/12/20  3:06 PM   Specimen: Nasopharyngeal Swab; Nasopharyngeal(NP) swabs in vial transport medium  Result Value Ref Range Status   SARS Coronavirus 2 by RT PCR NEGATIVE NEGATIVE Final    Comment: (NOTE) SARS-CoV-2 target nucleic acids are NOT DETECTED.  The SARS-CoV-2 RNA is generally detectable in upper respiratory specimens during the acute phase of infection. The lowest concentration of SARS-CoV-2 viral copies this assay can detect is 138 copies/mL. A negative result does not preclude SARS-Cov-2 infection and should not be used as the sole basis for treatment or other Scott Floyd management decisions. A negative result may occur with  improper specimen collection/handling, submission of specimen other than nasopharyngeal swab, presence of viral mutation(s) within the areas targeted by this assay, and inadequate number of viral copies(<138 copies/mL). A negative result must be combined with clinical observations, Scott Floyd history, and epidemiological information. The expected result is Negative.  Fact Sheet for Patients:  BloggerCourse.com  Fact Sheet for Healthcare Providers:  SeriousBroker.it  This test is no t yet approved or cleared by the [place] FDA and  has been  authorized for detection and/or diagnosis of SARS-CoV-2 by FDA under an Emergency Use Authorization (EUA). This EUA will remain  in effect (meaning this test can be used) for the duration of the COVID-19 declaration under Section 564(b)(1) of the Act, 21 U.S.C.section 360bbb-3(b)(1), unless the authorization is terminated  or revoked sooner.       Influenza A by PCR NEGATIVE NEGATIVE Final   Influenza B by PCR NEGATIVE NEGATIVE Final    Comment: (NOTE) The Xpert Xpress SARS-CoV-2/FLU/RSV plus assay is intended as an aid in the diagnosis of influenza from Nasopharyngeal swab specimens and should not be used as a sole basis for treatment. Nasal washings and aspirates are unacceptable for Xpert Xpress SARS-CoV-2/FLU/RSV testing.  Fact Sheet for Patients: BloggerCourse.com  Fact Sheet for Healthcare Providers: SeriousBroker.it  This test is not yet approved or cleared by the [place] FDA and has been authorized for detection and/or diagnosis of SARS-CoV-2 by FDA under an Emergency Use Authorization (EUA). This EUA will remain in effect (meaning this test can be used) for the duration of the COVID-19 declaration under Section 564(b)(1) of the Act, 21 U.S.C. section 360bbb-3(b)(1), unless the authorization is terminated or revoked.  Performed at [facility], [address] [place], [place] [address]      Labs: BNP (last 3 results) No results for input(s): BNP in the last 8760 hours. Basic Metabolic Panel: Recent Labs  Lab 08/12/20 1000 08/13/20 0525 08/14/20 0635  NA 132* 136 136  K 3.6 3.6 4.3  CL 99 104 101  CO2 GLUCOSE  125* 81 106*  BUN 17 14 12   CREATININE 0.67 0.76 0.79  CALCIUM 8.5* 8.5* 9.0   Liver Function Tests: Recent Labs  Lab 08/12/20 1000 08/13/20 0525 08/14/20 0635  AST 63* 22 36  ALT 166* 95* 86*  ALKPHOS 103 88 87  BILITOT 1.5* 1.5* 0.9  PROT 7.5 6.6 7.3  ALBUMIN 4.2  3.5 3.8   Recent Labs  Lab 08/12/20 1000 08/13/20 0525 08/14/20 0635  LIPASE 344* 64* 30   No results for input(s): AMMONIA in the last 168 hours. CBC: Recent Labs  Lab 08/12/20 1000 08/12/20 2030 08/13/20 0525  WBC 14.9*  --  12.6*  HGB 15.6 14.7 14.2  HCT 46.8  --  42.6  MCV 85.1  --  86.2  PLT 396  --  355   Cardiac Enzymes: No results for input(s): CKTOTAL, CKMB, CKMBINDEX, TROPONINI in the last 168 hours. BNP: Invalid input(s): POCBNP CBG: No results for input(s): GLUCAP in the last 168 hours. D-Dimer No results for input(s): DDIMER in the last 72 hours. Hgb A1c Recent Labs    08/12/20 1237  HGBA1C 5.5   Lipid Profile Recent Labs    08/12/20 1237  CHOL 151  HDL 37*  LDLCALC 97  TRIG 85  CHOLHDL 4.1   Thyroid function studies Recent Labs    08/12/20 1237  TSH 1.119   Anemia work up No results for input(s): VITAMINB12, FOLATE, FERRITIN, TIBC, IRON, RETICCTPCT in the last 72 hours. Urinalysis    Component Value Date/Time   COLORURINE YELLOW (A) 08/12/2020 1000   APPEARANCEUR HAZY (A) 08/12/2020 1000   LABSPEC 1.025 08/12/2020 1000   PHURINE 7.0 08/12/2020 1000   GLUCOSEU NEGATIVE 08/12/2020 1000   HGBUR NEGATIVE 08/12/2020 1000   BILIRUBINUR NEGATIVE 08/12/2020 1000   KETONESUR 5 (A) 08/12/2020 1000   PROTEINUR 30 (A) 08/12/2020 1000   NITRITE NEGATIVE 08/12/2020 1000   LEUKOCYTESUR NEGATIVE 08/12/2020 1000   Sepsis Labs Invalid input(s): PROCALCITONIN,  WBC,  LACTICIDVEN Microbiology Recent Results (from the past 240 hour(s))  Resp Panel by RT-PCR (Flu A&B, Covid) Nasopharyngeal Swab     Status: None   Collection Time: 08/12/20  3:06 PM   Specimen: Nasopharyngeal Swab; Nasopharyngeal(NP) swabs in vial transport medium  Result Value Ref Range Status   SARS Coronavirus 2 by RT PCR NEGATIVE NEGATIVE Final    Comment: (NOTE) SARS-CoV-2 target nucleic acids are NOT DETECTED.  The SARS-CoV-2 RNA is generally detectable in upper  respiratory specimens during the acute phase of infection. The lowest concentration of SARS-CoV-2 viral copies this assay can detect is 138 copies/mL. A negative result does not preclude SARS-Cov-2 infection and should not be used as the sole basis for treatment or other Scott Floyd management decisions. A negative result may occur with  improper specimen collection/handling, submission of specimen other than nasopharyngeal swab, presence of viral mutation(s) within the areas targeted by this assay, and inadequate number of viral copies(<138 copies/mL). A negative result must be combined with clinical observations, Scott Floyd history, and epidemiological information. The expected result is Negative.  Fact Sheet for Patients:  BloggerCourse.comhttps://www.fda.gov/media/152166/download  Fact Sheet for Healthcare Providers:  SeriousBroker.ithttps://www.fda.gov/media/152162/download  This test is no t yet approved or cleared by the [place] FDA and  has been authorized for detection and/or diagnosis of SARS-CoV-2 by FDA under an Emergency Use Authorization (EUA). This EUA will remain  in effect (meaning this test can be used) for the duration of the COVID-19 declaration under Section 564(b)(1) of the Act, 21 U.S.C.section  360bbb-3(b)(1), unless the authorization is terminated  or revoked sooner.       Influenza A by PCR NEGATIVE NEGATIVE Final   Influenza B by PCR NEGATIVE NEGATIVE Final    Comment: (NOTE) The Xpert Xpress SARS-CoV-2/FLU/RSV plus assay is intended as an aid in the diagnosis of influenza from Nasopharyngeal swab specimens and should not be used as a sole basis for treatment. Nasal washings and aspirates are unacceptable for Xpert Xpress SARS-CoV-2/FLU/RSV testing.  Fact Sheet for Patients: BloggerCourse.com  Fact Sheet for Healthcare Providers: SeriousBroker.it  This test is not yet approved or cleared by the [place] FDA and has been  authorized for detection and/or diagnosis of SARS-CoV-2 by FDA under an Emergency Use Authorization (EUA). This EUA will remain in effect (meaning this test can be used) for the duration of the COVID-19 declaration under Section 564(b)(1) of the Act, 21 U.S.C. section 360bbb-3(b)(1), unless the authorization is terminated or revoked.  Performed at [facility], [address], [place], [place] [address]     Please note: You were cared for by a hospitalist during your hospital stay. Once you are discharged, your primary care physician will handle any further medical issues. Please note that NO REFILLS for any discharge medications will be authorized once you are discharged, as it is imperative that you return to your primary care physician (or establish a relationship with a primary care physician if you do not have one) for your post hospital discharge needs so that they can reassess your need for medications and monitor your lab values.    Time coordinating discharge: 40 minutes  SIGNED:   Burnadette Pop, MD  [facility] 08/15/2020, 12:18 PM Pager [phone]  If 7PM-7AM, please contact night-coverage [email or web address] Password [email or web address]

## 2020-08-15 NOTE — Progress Notes (Signed)
08/15/2020  Subjective: Patient is 2 Days Post-Op s/p robotic assisted cholecystectomy.  No acute events.  Patient tolerating diet and denies any nausea or vomiting.  Reports pain in one of the incisions on the left side.    Vital signs: Temp:  [97.8 F (36.6 C)-98 F (36.7 C)] 97.8 F (36.6 C) (12/31 0809) Pulse Rate:  [73-78] 77 (12/31 0809) Resp:  [18-20] 18 (12/31 0527) BP: (130-143)/(84-98) 138/98 (12/31 0809) SpO2:  [97 %-100 %] 97 % (12/31 0809)   Intake/Output: 12/30 0701 - 12/31 0700 In: 840 [P.O.:840] Out: -  Last BM Date: 08/12/20  Physical Exam: Constitutional:  No acute distress Abdomen:  Soft, obese, non-distended, non-tender to palpation except with some tenderness over one left sided port incision, which has some mild surrounding erythema.  No drainage or wound breakdown.  Labs:  Recent Labs    08/12/20 1000 08/12/20 2030 08/13/20 0525  WBC 14.9*  --  12.6*  HGB 15.6 14.7 14.2  HCT 46.8  --  42.6  PLT 396  --  355   Recent Labs    08/13/20 0525 08/14/20 0635  NA 136 136  K 3.6 4.3  CL 104 101  CO2 24 26  GLUCOSE 81 106*  BUN 14 12  CREATININE 0.76 0.79  CALCIUM 8.5* 9.0   No results for input(s): LABPROT, INR in the last 72 hours.  Imaging: No results found.  Assessment/Plan: This is a 24 y.o. male s/p robotic assisted cholecystectomy.  --Discussed with the patient that he may be getting a mild wound infection.  Will start him on Augmentin for home. --Can discharge home today from surgical standpoint.  Prescriptions for abx and pain meds sent to his pharmacy. --Follow up in our office in two weeks.   Howie Ill, MD [facility]

## 2020-08-18 LAB — SURGICAL PATHOLOGY

## 2020-08-21 NOTE — Anesthesia Postprocedure Evaluation (Signed)
Anesthesia Post Note  Patient: [place] Alverio  Procedure(s) Performed: XI ROBOTIC ASSISTED LAPAROSCOPIC CHOLECYSTECTOMY (N/A ) INDOCYANINE GREEN FLUORESCENCE IMAGING (ICG)  Patient location during evaluation: PACU Anesthesia Type: General Level of consciousness: awake and alert Pain management: pain level controlled Vital Signs Assessment: post-procedure vital signs reviewed and stable Respiratory status: spontaneous breathing, nonlabored ventilation, respiratory function stable and patient connected to nasal cannula oxygen Cardiovascular status: blood pressure returned to baseline and stable Postop Assessment: no apparent nausea or vomiting Anesthetic complications: no   No complications documented.   Last Vitals:  Vitals:   08/15/20 0809 08/15/20 1124  BP: (!) 138/98 (!) 154/92  Pulse: 77 65  Resp:  15  Temp: 36.6 C 36.6 C  SpO2: 97% 98%    Last Pain:  Vitals:   08/15/20 1124  TempSrc: Oral  PainSc:                  Yevette Edwards

## 2020-10-17 ENCOUNTER — Other Ambulatory Visit: Payer: Self-pay

## 2020-10-17 ENCOUNTER — Ambulatory Visit
Admission: RE | Admit: 2020-10-17 | Discharge: 2020-10-17 | Disposition: A | Discharge status: Home / Self Care | Payer: BLUE CROSS/BLUE SHIELD | Source: Ambulatory Visit | Attending: Family Medicine | Admitting: Family Medicine

## 2020-10-17 VITALS — BP 156/99 | HR 98 | Temp 99.1°F | Resp 18 | Ht 72.0 in | Wt 280.0 lb

## 2020-10-17 DIAGNOSIS — B349 Viral infection, unspecified: Secondary | ICD-10-CM | POA: Diagnosis not present

## 2020-10-17 NOTE — ED Provider Notes (Signed)
[name]    CSN: [id number] Arrival date & time: 10/17/20  1315      History   Chief Complaint Chief Complaint  Patient presents with  . Sore Throat  . Nasal Congestion  . Chills  . Fever    HPI Scott Floyd is a 25 y.o. male.   Pt is a 25 year old male that presents with cough, sore throat, ear pain, congestion. Productive cough. Symptoms have improved. He has been using mucinex.      No past medical history on file.  Patient Active Problem List   Diagnosis Date Noted  . Acute gallstone pancreatitis   . Acute pancreatitis 08/12/2020    No past surgical history on file.     Home Medications    Prior to Admission medications   Not on File    Family History No family history on file.  Social History Social History   Tobacco Use  . Smoking status: Never Smoker  Substance Use Topics  . Alcohol use: No  . Drug use: No     Allergies   Patient has no known allergies.   Review of Systems Review of Systems   Physical Exam Triage Vital Signs ED Triage Vitals  Enc Vitals Group     BP 10/17/20 1339 (!) 156/99     Pulse Rate 10/17/20 1339 98     Resp 10/17/20 1339 18     Temp 10/17/20 1339 99.1 F (37.3 C)     Temp Source 10/17/20 1339 Oral     SpO2 10/17/20 1339 96 %     Weight 10/17/20 1332 280 lb (127 kg)     Height 10/17/20 1332 6' (1.829 m)     Head Circumference --      Peak Flow --      Pain Score 10/17/20 1332 0     Pain Loc --      Pain Edu? --      Excl. in GC? --    No data found.  Updated Vital Signs BP (!) 156/99 (BP Location: Left Arm)   Pulse 98   Temp 99.1 F (37.3 C) (Oral)   Resp 18   Ht 6' (1.829 m)   Wt 280 lb (127 kg)   SpO2 96%   BMI 37.97 kg/m   Visual Acuity Right Eye Distance:   Left Eye Distance:   Bilateral Distance:    Right Eye Near:   Left Eye Near:    Bilateral Near:     Physical Exam Vitals and nursing note reviewed.  Constitutional:      General: He is not in acute  distress.    Appearance: Normal appearance. He is not ill-appearing, toxic-appearing or diaphoretic.  HENT:     Head: Normocephalic and atraumatic.     Right Ear: Tympanic membrane and ear canal normal.     Left Ear: Tympanic membrane and ear canal normal.     Nose: Congestion present.     Mouth/Throat:     Pharynx: Oropharynx is clear. Posterior oropharyngeal erythema present.  Eyes:     Conjunctiva/sclera: Conjunctivae normal.  Cardiovascular:     Rate and Rhythm: Normal rate and regular rhythm.  Pulmonary:     Effort: Pulmonary effort is normal.     Breath sounds: Normal breath sounds.  Musculoskeletal:        General: Normal range of motion.     Cervical back: Normal range of motion.  Skin:    General: Skin is  warm and dry.  Neurological:     Mental Status: He is alert.  Psychiatric:        Mood and Affect: Mood normal.      UC Treatments / Results  Labs (all labs ordered are listed, but only abnormal results are displayed) Labs Reviewed - No data to display  EKG   Radiology No results found.  Procedures Procedures (including critical care time)  Medications Ordered in UC Medications - No data to display  Initial Impression / Assessment and Plan / UC Course  I have reviewed the triage vital signs and the nursing notes.  Pertinent labs & imaging results that were available during my care of the patient were reviewed by me and considered in my medical decision making (see chart for details).     Viral illness No concerns on exam Symptoms improving.  OTC meds as needed.  Follow up as needed for continued or worsening symptoms  Final Clinical Impressions(s) / UC Diagnoses   Final diagnoses:  Viral illness     Discharge Instructions     This is most likely viral Continue OTC meds as needed.  Rest, hydrate Follow up as needed for continued or worsening symptoms     ED Prescriptions    None     PDMP not reviewed this encounter.   Janace Aris, NP 10/17/20 1357

## 2020-10-17 NOTE — Discharge Instructions (Signed)
This is most likely viral Continue OTC meds as needed.  Rest, hydrate Follow up as needed for continued or worsening symptoms

## 2020-10-17 NOTE — ED Triage Notes (Signed)
Patient in [facility] today with Cough,congestion, mucus(yellow) sorethroat and chills sx started on Tuesday . Did vomited on Monday and had diarrhea. Nothing since  Denies:Nausea, HA  OTC: Mucinex

## 2020-10-18 LAB — NOVEL CORONAVIRUS, NAA: SARS-CoV-2, NAA: NOT DETECTED

## 2020-10-18 LAB — SARS-COV-2, NAA 2 DAY TAT

## 2022-07-23 IMAGING — NM NM HEPATOBILIARY IMAGE, INC GB
1 series · 6 of 6 positions shown · non-contrast
Comparison: Ultrasound 08/12/2020, CT 08/12/2020

CLINICAL DATA: Right upper quadrant pain pancreatitis

EXAM:
NUCLEAR MEDICINE HEPATOBILIARY IMAGING
TECHNIQUE: Sequential images of the abdomen were obtained [DATE] minutes
following intravenous administration of radiopharmaceutical.
RADIOPHARMACEUTICALS:  4.981 mCi Jc-TTm  Choletec IV

[Series 1000: hepatobiliary scan · 9.59mm/px · 6 of 60 frames shown]
[frame 6/60]
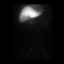
[frame 16/60]
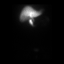
[frame 26/60]
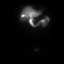
[frame 36/60]
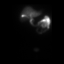
[frame 46/60]
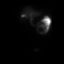
[frame 56/60]
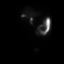

[6 of 6 positions shown; findings below may reference images not displayed]

FINDINGS: Prompt uptake and biliary excretion of activity by the liver is
seen. Gallbladder activity is visualized, consistent with patency of
cystic duct. Biliary activity passes into small bowel, consistent
with patent common bile duct.
IMPRESSION: Negative examination

## 2022-07-23 IMAGING — US US ABDOMEN LIMITED
1 series · 14 of 25 positions shown · non-contrast
Comparison: None.

CLINICAL DATA: Pain with nausea and vomiting

EXAM:
ULTRASOUND ABDOMEN LIMITED RIGHT UPPER QUADRANT

[Series 1: us abdomen limited ruq (liver/gb) · 14 of 39 slices shown]
[im 1/39]
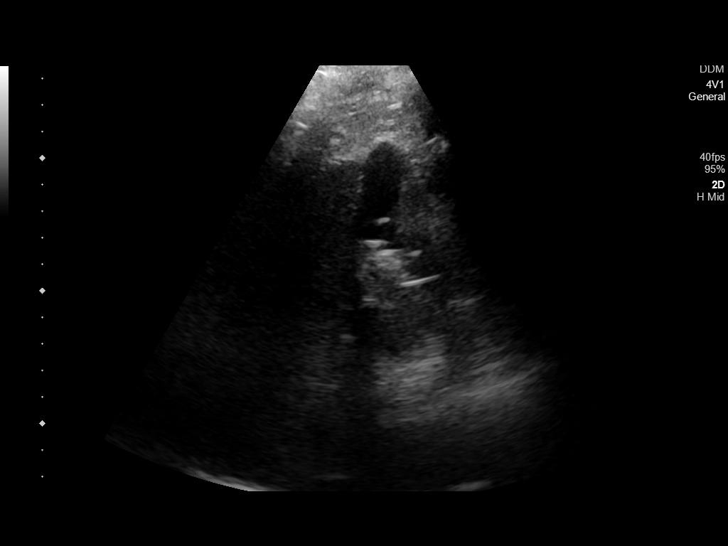
[im 4/39]
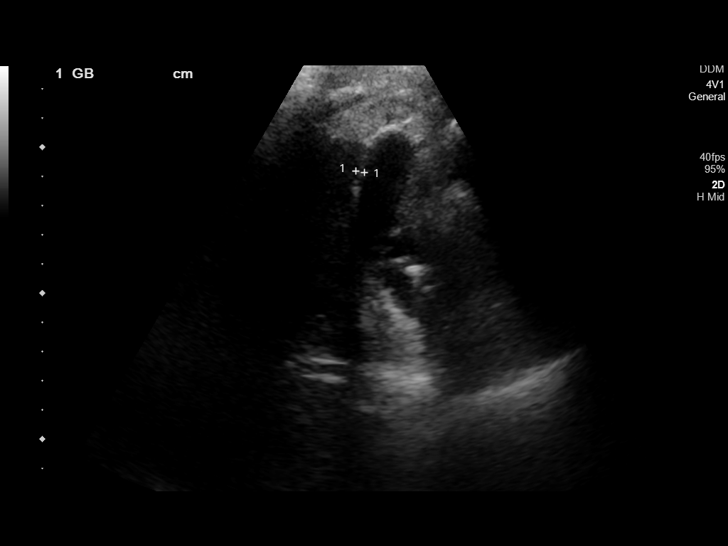
[im 7/39]
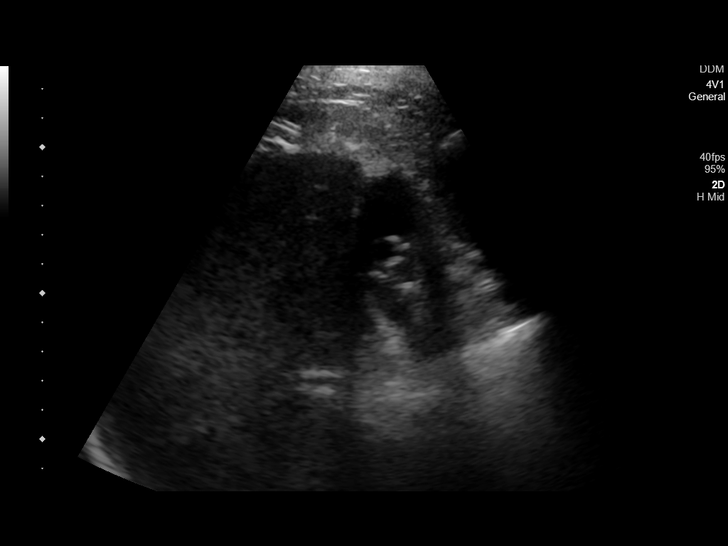
[im 10/39]
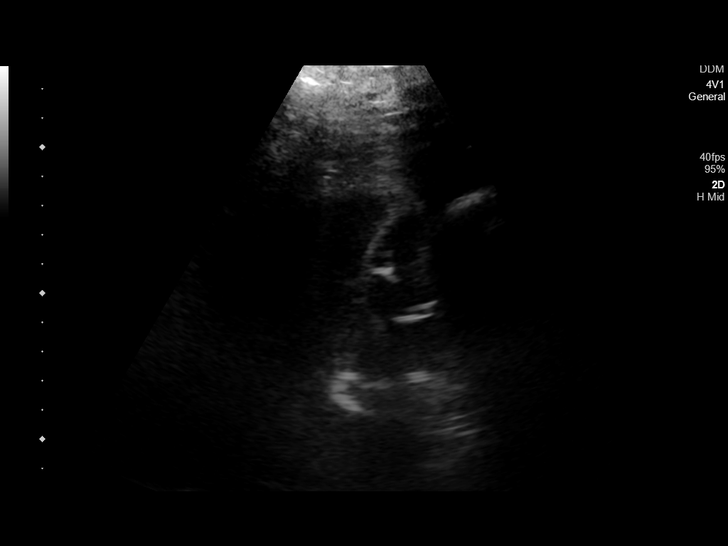
[im 13/39]
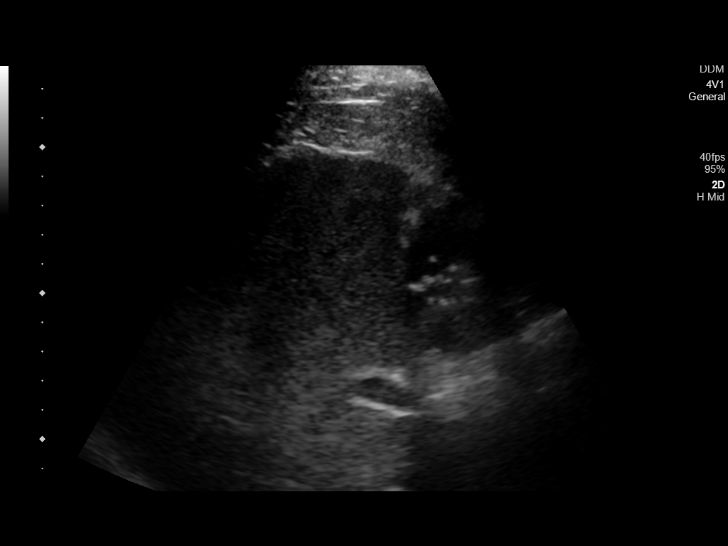
[im 15/39]
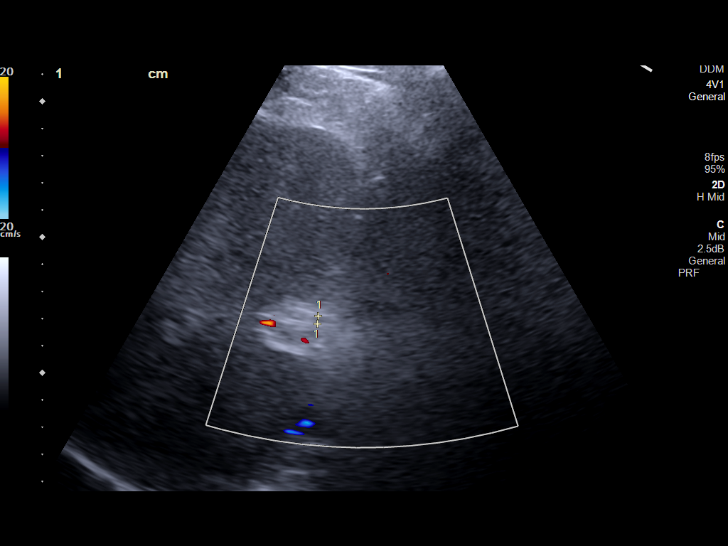
[im 18/39]
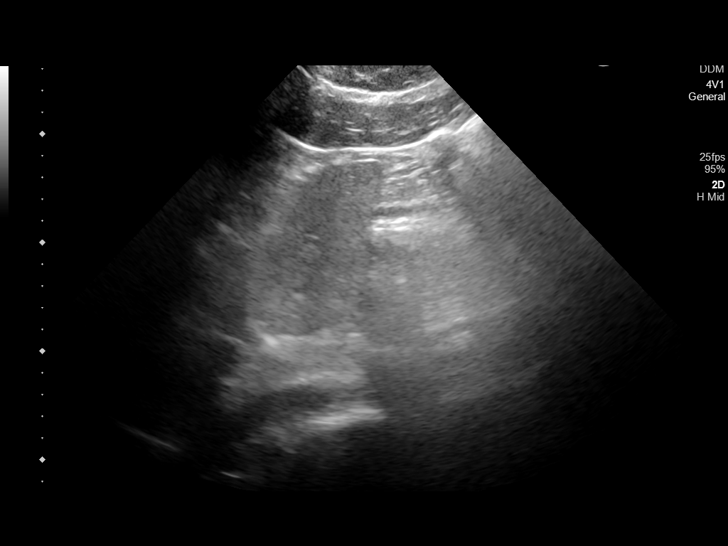
[im 21/39]
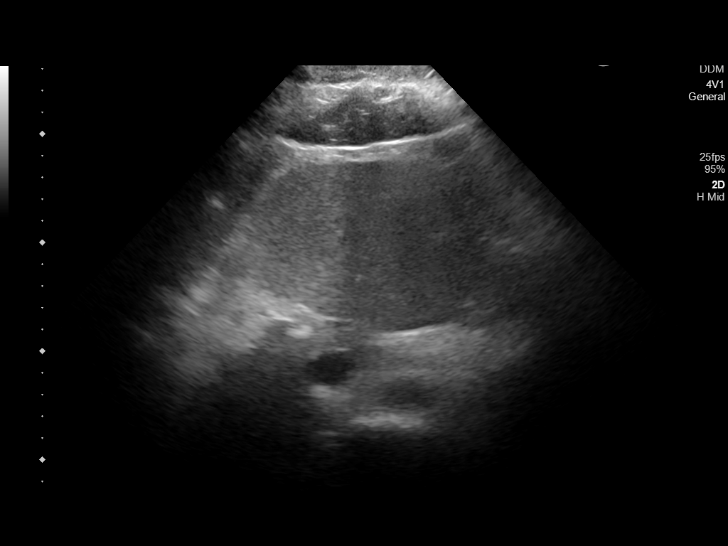
[im 24/39]
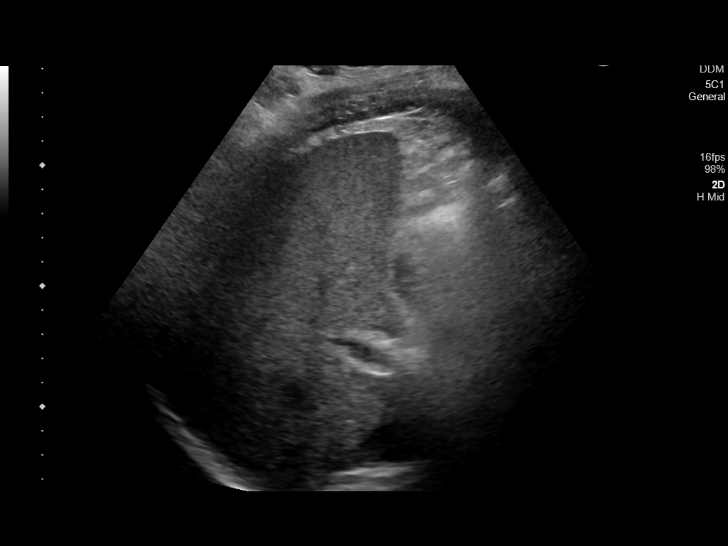
[im 26/39]
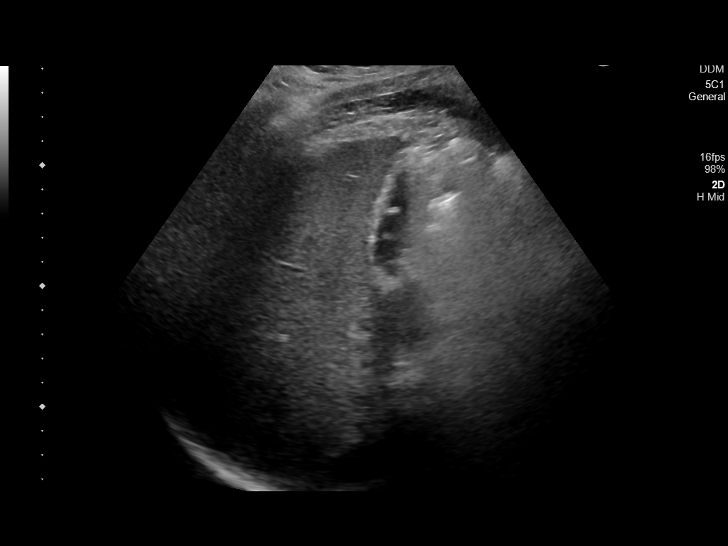
[im 29/39]
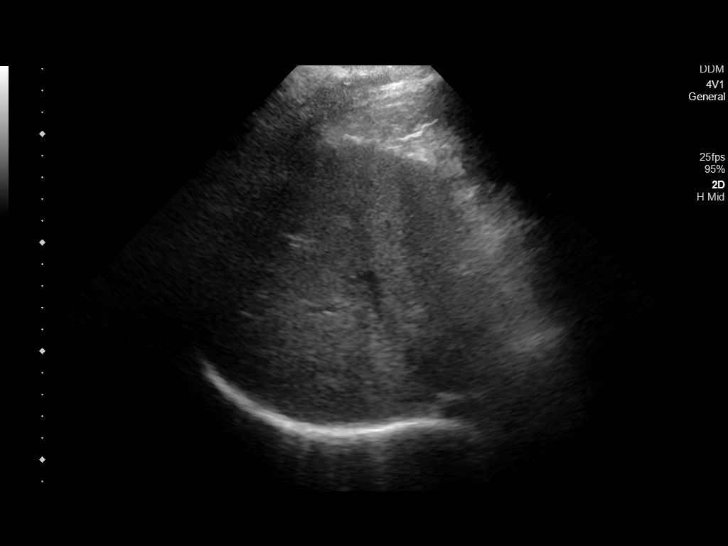
[im 32/39]
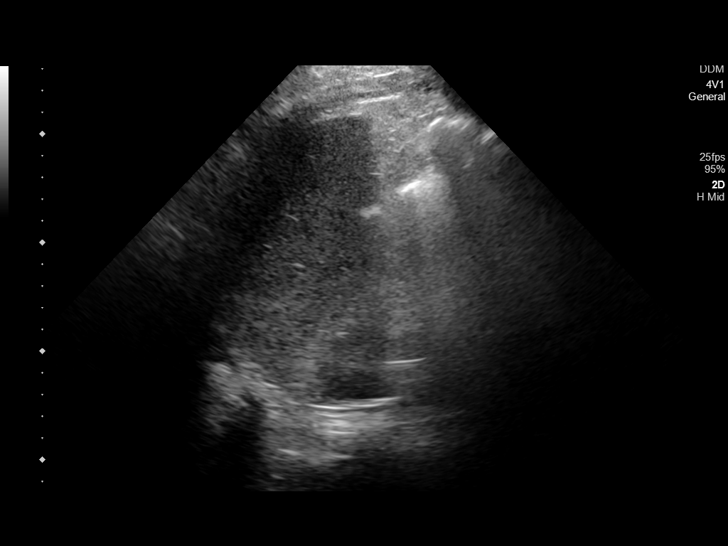
[im 35/39]
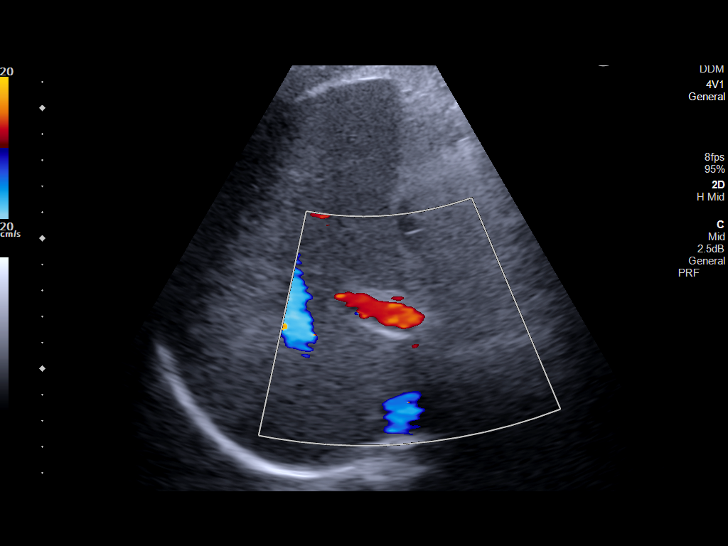
[im 39/39]
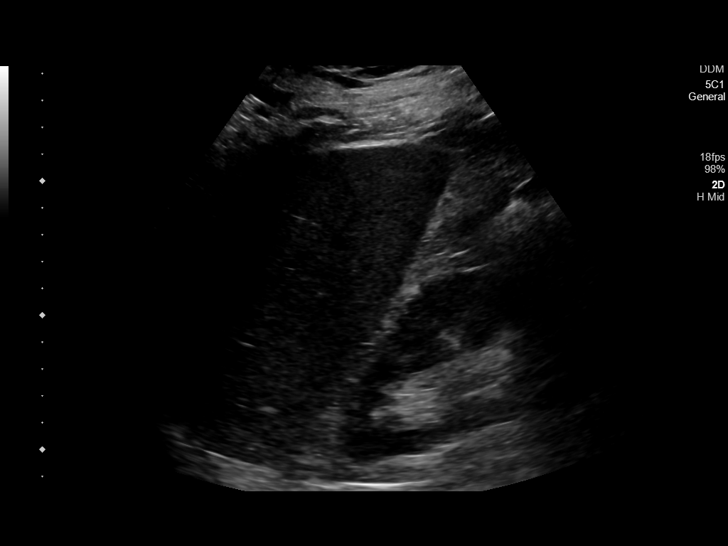

[14 of 25 positions shown; findings below may reference images not displayed]

FINDINGS: Gallbladder:

Within the gallbladder, there are echogenic foci which move and
shadow consistent with cholelithiasis. Largest gallstone measures 7
mm in length. Gallbladder wall thickness is upper normal. There is
no pericholecystic fluid. No sonographic Murphy sign noted by
sonographer.

Common bile duct:

Diameter: 3 mm. No intrahepatic or extrahepatic biliary duct
dilatation.

Liver:

No focal lesion identified. Liver echogenicity overall increased.
Portal vein is patent on color Doppler imaging with normal direction
of blood flow towards the liver.

Other: None.
IMPRESSION: 1. Cholelithiasis. Gallbladder wall thickness upper normal. This
circumstance may warrant nuclear medicine hepatobiliary imaging
study to assess for cystic duct patency.

2. Increase in liver echogenicity, an appearance most likely
indicative of hepatic steatosis. No focal liver lesions evident.
Note that the sensitivity of ultrasound for detection of focal liver
lesions is diminished given this degree of apparent underlying
hepatic steatosis.
